# Patient Record
Sex: Male | Born: 1980 | Race: Black or African American | Hispanic: No | Smoking: Current some day smoker
Health system: Southern US, Community
[De-identification: ages and names within clinical notes are randomized; demographics above are authoritative.]

## PROBLEM LIST (undated history)

## (undated) DIAGNOSIS — I10 Essential (primary) hypertension: Secondary | ICD-10-CM

## (undated) DIAGNOSIS — N289 Disorder of kidney and ureter, unspecified: Secondary | ICD-10-CM

## (undated) DIAGNOSIS — I214 Non-ST elevation (NSTEMI) myocardial infarction: Secondary | ICD-10-CM

## (undated) DIAGNOSIS — I2510 Atherosclerotic heart disease of native coronary artery without angina pectoris: Secondary | ICD-10-CM

## (undated) DIAGNOSIS — Z951 Presence of aortocoronary bypass graft: Secondary | ICD-10-CM

## (undated) DIAGNOSIS — I255 Ischemic cardiomyopathy: Secondary | ICD-10-CM

---
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
## 2005-03-25 ENCOUNTER — Emergency Department (HOSPITAL_COMMUNITY): Admission: EM | Admit: 2005-03-25 | Discharge: 2005-03-25 | Payer: Self-pay | Admitting: Family Medicine

## 2005-04-22 ENCOUNTER — Emergency Department (HOSPITAL_COMMUNITY): Admission: EM | Admit: 2005-04-22 | Discharge: 2005-04-22 | Payer: Self-pay | Admitting: Family Medicine

## 2005-07-17 ENCOUNTER — Emergency Department (HOSPITAL_COMMUNITY): Admission: EM | Admit: 2005-07-17 | Discharge: 2005-07-17 | Payer: Self-pay | Admitting: Family Medicine

## 2006-01-04 ENCOUNTER — Emergency Department (HOSPITAL_COMMUNITY): Admission: AD | Admit: 2006-01-04 | Discharge: 2006-01-04 | Payer: Self-pay | Admitting: Family Medicine

## 2006-12-25 ENCOUNTER — Emergency Department (HOSPITAL_COMMUNITY): Admission: EM | Admit: 2006-12-25 | Discharge: 2006-12-25 | Payer: Self-pay | Admitting: Emergency Medicine

## 2007-04-03 ENCOUNTER — Emergency Department (HOSPITAL_COMMUNITY): Admission: EM | Admit: 2007-04-03 | Discharge: 2007-04-03 | Payer: Self-pay | Admitting: Family Medicine

## 2007-04-26 ENCOUNTER — Emergency Department (HOSPITAL_COMMUNITY): Admission: EM | Admit: 2007-04-26 | Discharge: 2007-04-27 | Payer: Self-pay | Admitting: Emergency Medicine

## 2007-05-26 ENCOUNTER — Emergency Department (HOSPITAL_COMMUNITY): Admission: EM | Admit: 2007-05-26 | Discharge: 2007-05-26 | Payer: Self-pay | Admitting: Emergency Medicine

## 2008-03-11 ENCOUNTER — Emergency Department (HOSPITAL_COMMUNITY): Admission: EM | Admit: 2008-03-11 | Discharge: 2008-03-11 | Payer: Self-pay | Admitting: Emergency Medicine

## 2008-10-20 ENCOUNTER — Emergency Department (HOSPITAL_COMMUNITY): Admission: EM | Admit: 2008-10-20 | Discharge: 2008-10-20 | Payer: Self-pay | Admitting: Family Medicine

## 2008-11-22 ENCOUNTER — Emergency Department (HOSPITAL_COMMUNITY): Admission: EM | Admit: 2008-11-22 | Discharge: 2008-11-22 | Payer: Self-pay | Admitting: Family Medicine

## 2009-01-24 ENCOUNTER — Emergency Department (HOSPITAL_COMMUNITY): Admission: EM | Admit: 2009-01-24 | Discharge: 2009-01-24 | Payer: Self-pay | Admitting: Family Medicine

## 2010-05-31 ENCOUNTER — Emergency Department (HOSPITAL_COMMUNITY)
Admission: EM | Admit: 2010-05-31 | Discharge: 2010-05-31 | Disposition: A | Discharge status: Home / Self Care | Payer: Self-pay | Attending: Emergency Medicine | Admitting: Emergency Medicine

## 2010-05-31 DIAGNOSIS — F1210 Cannabis abuse, uncomplicated: Secondary | ICD-10-CM | POA: Insufficient documentation

## 2010-05-31 DIAGNOSIS — R112 Nausea with vomiting, unspecified: Secondary | ICD-10-CM | POA: Insufficient documentation

## 2010-08-23 ENCOUNTER — Ambulatory Visit: Payer: Self-pay | Admitting: Sports Medicine

## 2010-12-14 LAB — CBC
HCT: 52
Hemoglobin: 17.6 — ABNORMAL HIGH
MCHC: 33.9
MCV: 87.7
RBC: 5.93 — ABNORMAL HIGH
RDW: 14.8

## 2010-12-14 LAB — COMPREHENSIVE METABOLIC PANEL
BUN: 7
CO2: 27
Calcium: 10.2
GFR calc non Af Amer: >60
Glucose, Bld: 114 — ABNORMAL HIGH
Total Protein: 8.3

## 2010-12-14 LAB — DIFFERENTIAL
Basophils Relative: 0
Eosinophils Absolute: 0
Lymphs Abs: 0.5 — ABNORMAL LOW
Monocytes Relative: 4
Neutro Abs: 11.3 — ABNORMAL HIGH
Neutrophils Relative %: 92 — ABNORMAL HIGH

## 2010-12-14 LAB — URINALYSIS, ROUTINE W REFLEX MICROSCOPIC
Bilirubin Urine: NEGATIVE
Glucose, UA: NEGATIVE
Hgb urine dipstick: NEGATIVE
Ketones, ur: NEGATIVE
pH: 5.5

## 2010-12-17 LAB — MONONUCLEOSIS SCREEN: Mono Screen: NEGATIVE

## 2010-12-17 LAB — INFLUENZA A+B VIRUS AG-DIRECT(RAPID): Inflenza A Ag: NEGATIVE

## 2010-12-17 LAB — RAPID STREP SCREEN (MED CTR MEBANE ONLY): Streptococcus, Group A Screen (Direct): NEGATIVE

## 2010-12-28 LAB — COMPREHENSIVE METABOLIC PANEL
ALT: 33 U/L (ref 0–53)
Alkaline Phosphatase: 78 U/L (ref 39–117)
BUN: 11 mg/dL (ref 6–23)
CO2: 24 mEq/L (ref 19–32)
Chloride: 102 mEq/L (ref 96–112)
GFR calc non Af Amer: >60 mL/min (ref >60)
Glucose, Bld: 105 mg/dL — ABNORMAL HIGH (ref 70–99)
Potassium: 3.9 mEq/L (ref 3.5–5.1)
Sodium: 139 mEq/L (ref 135–145)
Total Bilirubin: 1.9 mg/dL — ABNORMAL HIGH (ref 0.3–1.2)

## 2010-12-28 LAB — URINE MICROSCOPIC-ADD ON

## 2010-12-28 LAB — CBC
HCT: 55.6 % — ABNORMAL HIGH (ref 39.0–52.0)
Hemoglobin: 18.6 g/dL — ABNORMAL HIGH (ref 13.0–17.0)
RBC: 6.3 MIL/uL — ABNORMAL HIGH (ref 4.22–5.81)

## 2010-12-28 LAB — URINALYSIS, ROUTINE W REFLEX MICROSCOPIC
Glucose, UA: NEGATIVE mg/dL
Ketones, ur: 15 mg/dL — AB
Leukocytes, UA: NEGATIVE
Protein, ur: >300 mg/dL — AB
pH: 5.5 (ref 5.0–8.0)

## 2010-12-28 LAB — DIFFERENTIAL
Basophils Absolute: 0 10*3/uL (ref 0.0–0.1)
Basophils Relative: 0 % (ref 0–1)
Eosinophils Absolute: 0 10*3/uL (ref 0.0–0.7)
Monocytes Relative: 5 % (ref 3–12)
Neutro Abs: 8 10*3/uL — ABNORMAL HIGH (ref 1.7–7.7)
Neutrophils Relative %: 79 % — ABNORMAL HIGH (ref 43–77)

## 2011-02-15 ENCOUNTER — Emergency Department (HOSPITAL_COMMUNITY): Payer: Self-pay

## 2011-02-15 ENCOUNTER — Encounter: Payer: Self-pay | Admitting: *Deleted

## 2011-02-15 ENCOUNTER — Emergency Department (HOSPITAL_COMMUNITY)
Admission: EM | Admit: 2011-02-15 | Discharge: 2011-02-15 | Disposition: A | Discharge status: Home / Self Care | Payer: Self-pay | Attending: Emergency Medicine | Admitting: Emergency Medicine

## 2011-02-15 DIAGNOSIS — Z79899 Other long term (current) drug therapy: Secondary | ICD-10-CM | POA: Insufficient documentation

## 2011-02-15 DIAGNOSIS — R112 Nausea with vomiting, unspecified: Secondary | ICD-10-CM | POA: Insufficient documentation

## 2011-02-15 DIAGNOSIS — G43909 Migraine, unspecified, not intractable, without status migrainosus: Secondary | ICD-10-CM | POA: Insufficient documentation

## 2011-02-15 DIAGNOSIS — J019 Acute sinusitis, unspecified: Secondary | ICD-10-CM | POA: Insufficient documentation

## 2011-02-15 LAB — GLUCOSE, CAPILLARY: Glucose-Capillary: 108 mg/dL — ABNORMAL HIGH (ref 70–99)

## 2011-02-15 MED ORDER — AMOXICILLIN 500 MG PO CAPS: 1000.0000 mg | ORAL_CAPSULE | Freq: Three times a day (TID) | ORAL | Status: AC

## 2011-02-15 MED ORDER — SODIUM CHLORIDE 0.9 % IV BOLUS (SEPSIS)
1000.0000 mL | Freq: Once | INTRAVENOUS | Status: AC
  Administered 2011-02-15: 1000 mL via INTRAVENOUS

## 2011-02-15 MED ORDER — OXYCODONE-ACETAMINOPHEN 5-325 MG PO TABS: 2.0000 | ORAL_TABLET | ORAL | Status: AC | PRN

## 2011-02-15 MED ORDER — HYDROMORPHONE HCL PF 1 MG/ML IJ SOLN: 1.0000 mg | Freq: Once | INTRAMUSCULAR | Status: DC

## 2011-02-15 MED ORDER — ONDANSETRON HCL 4 MG/2ML IJ SOLN
INTRAMUSCULAR | Status: AC
  Administered 2011-02-15: 4 mg via INTRAVENOUS
  Filled 2011-02-15: qty 2

## 2011-02-15 MED ORDER — AMOXICILLIN 500 MG PO CAPS
1000.0000 mg | ORAL_CAPSULE | Freq: Once | ORAL | Status: AC
  Administered 2011-02-15 (×2): 1000 mg via ORAL
  Filled 2011-02-15: qty 1

## 2011-02-15 MED ORDER — AMOXICILLIN 500 MG PO CAPS
ORAL_CAPSULE | ORAL | Status: AC
  Administered 2011-02-15: 1000 mg via ORAL
  Filled 2011-02-15: qty 1

## 2011-02-15 MED ORDER — DIPHENHYDRAMINE HCL 50 MG/ML IJ SOLN
25.0000 mg | Freq: Once | INTRAMUSCULAR | Status: AC
  Administered 2011-02-15: 25 mg via INTRAVENOUS
  Filled 2011-02-15: qty 1

## 2011-02-15 MED ORDER — DEXAMETHASONE SODIUM PHOSPHATE 10 MG/ML IJ SOLN
10.0000 mg | Freq: Once | INTRAMUSCULAR | Status: AC
  Administered 2011-02-15: 10 mg via INTRAVENOUS
  Filled 2011-02-15: qty 1

## 2011-02-15 MED ORDER — PROMETHAZINE HCL 25 MG/ML IJ SOLN
25.0000 mg | Freq: Once | INTRAMUSCULAR | Status: AC
  Administered 2011-02-15: 25 mg via INTRAVENOUS
  Filled 2011-02-15: qty 1

## 2011-02-15 MED ORDER — IBUPROFEN 800 MG PO TABS
800.0000 mg | ORAL_TABLET | Freq: Once | ORAL | Status: AC
  Administered 2011-02-15: 800 mg via ORAL
  Filled 2011-02-15: qty 1

## 2011-02-15 MED ORDER — IBUPROFEN 800 MG PO TABS: 800.0000 mg | ORAL_TABLET | Freq: Three times a day (TID) | ORAL | Status: AC | PRN

## 2011-02-15 NOTE — ED Provider Notes (Signed)
History     CSN: [id number] Arrival date & time: 02/15/2011 10:37 AM   First MD Initiated Contact with Patient 02/15/11 1048      Chief Complaint  Patient presents with  . Headache  . Nausea    (Consider location/radiation/quality/duration/timing/severity/associated sxs/prior treatment) HPI Comments: The patient is a 30 year old male with a history of recurrent headaches who presents with a headache that he says is typical of his usual headaches. He reports that the headache started approximately one week ago gradually getting worse, starting as a mild headache, persisting, and gradually worsening to what would be a moderate to severe intensity headache. He reports that he has had nausea and some episodes of vomiting with the headache. He denies photophobia, visual change, hearing change, although he reports that loud noise makes his headache worse. He denies any fever, chills, rash, neck stiffness, or focal neurologic abnormality such as numbness or weakness. He denies any preceding head injury.  Patient is a 30 y.o. male presenting with headaches. The history is provided by the patient.  Headache  This is a recurrent problem. The current episode started more than 1 week ago. The problem occurs constantly. The problem has been gradually worsening. The headache is associated with loud noise. The pain is located in the frontal, bilateral and occipital region. The quality of the pain is described as dull. The pain is at a severity of 8/10. The pain does not radiate. Associated symptoms include nausea and vomiting. Pertinent negatives include no anorexia, no fever, no malaise/fatigue, no chest pressure, no near-syncope, no orthopnea, no palpitations, no syncope and no shortness of breath. He has tried nothing for the symptoms.    Past Medical History  Diagnosis Date  . Migraine     History reviewed. No pertinent past surgical history.  History reviewed. No pertinent family  history.  History  Substance Use Topics  . Smoking status: Current Everyday Smoker  . Smokeless tobacco: Not on file  . Alcohol Use: No      Review of Systems  Constitutional: Negative for fever, chills, malaise/fatigue, diaphoresis, activity change, appetite change, fatigue and unexpected weight change.  HENT: Negative for hearing loss, ear pain, nosebleeds, congestion, sore throat, facial swelling, rhinorrhea, sneezing, mouth sores, trouble swallowing, neck pain, neck stiffness, dental problem, postnasal drip, sinus pressure, tinnitus and ear discharge.   Eyes: Negative for photophobia, pain, discharge, redness, itching and visual disturbance.  Respiratory: Negative for cough, chest tightness and shortness of breath.   Cardiovascular: Negative for chest pain, palpitations, orthopnea, syncope and near-syncope.  Gastrointestinal: Positive for nausea and vomiting. Negative for abdominal pain, diarrhea and anorexia.  Genitourinary: Negative.   Musculoskeletal: Negative for myalgias and back pain.  Skin: Negative for color change, pallor, rash and wound.  Neurological: Positive for headaches. Negative for dizziness, tremors, seizures, syncope, facial asymmetry, speech difficulty, weakness, light-headedness and numbness.  Hematological: Negative for adenopathy. Does not bruise/bleed easily.  Psychiatric/Behavioral: Negative.     Allergies  Review of patient's allergies indicates no known allergies.  Home Medications   Current Outpatient Rx  Name Route Sig Dispense Refill  . ACETAMINOPHEN 500 MG PO TABS Oral Take 1,000 mg by mouth every 6 (six) hours as needed.      . GUAIFENESIN-DM 100-10 MG/5ML PO SYRP Oral Take 20 mLs by mouth 3 (three) times daily as needed.      Doreatha Martin MULTI-SYMPTOM PO Oral Take 20 mLs by mouth 2 (two) times daily.        BP  145/90  Pulse 91  Temp(Src) 99.2 F (37.3 C) (Oral)  Resp 22  Ht 5\' 8"  (1.727 m)  Wt 215 lb (97.523 kg)  BMI 32.69 kg/m2  SpO2  100%  Physical Exam  Nursing note and vitals reviewed. Constitutional: He is oriented to person, place, and time. He appears well-developed and well-nourished. He appears distressed.  HENT:  Head: Normocephalic and atraumatic. Head is without raccoon's eyes, without Battle's sign, without abrasion, without contusion, without right periorbital erythema and without left periorbital erythema.  Right Ear: Hearing, tympanic membrane, external ear and ear canal normal.  Left Ear: Hearing, tympanic membrane, external ear and ear canal normal.  Nose: No mucosal edema, rhinorrhea, sinus tenderness, nasal deformity, septal deviation or nasal septal hematoma. No epistaxis. Right sinus exhibits no maxillary sinus tenderness and no frontal sinus tenderness. Left sinus exhibits no maxillary sinus tenderness and no frontal sinus tenderness.  Mouth/Throat: Uvula is midline, oropharynx is clear and moist and mucous membranes are normal. No oropharyngeal exudate.  Eyes: Conjunctivae, EOM and lids are normal. Pupils are equal, round, and reactive to light. Right eye exhibits no chemosis, no discharge, no exudate and no hordeolum. No foreign body present in the right eye. Left eye exhibits no chemosis, no discharge, no exudate and no hordeolum. No foreign body present in the left eye. Right conjunctiva is not injected. Right conjunctiva has no hemorrhage. Left conjunctiva is not injected. Left conjunctiva has no hemorrhage. Right eye exhibits normal extraocular motion and no nystagmus. Left eye exhibits normal extraocular motion and no nystagmus.  Fundoscopic exam:      The right eye shows no AV nicking, no exudate, no hemorrhage and no papilledema.       The left eye shows no AV nicking, no exudate, no hemorrhage and no papilledema.  Neck: Trachea normal, normal range of motion, full passive range of motion without pain and phonation normal. Neck supple. No JVD present. No tracheal tenderness, no spinous process  tenderness and no muscular tenderness present. Carotid bruit is not present. No rigidity. No tracheal deviation, no edema, no erythema and normal range of motion present. No Brudzinski's sign noted. No mass and no thyromegaly present.  Cardiovascular: Normal rate, regular rhythm, normal heart sounds and intact distal pulses.  Exam reveals no gallop and no friction rub.   No murmur heard. Pulmonary/Chest: Effort normal and breath sounds normal. No stridor. No respiratory distress. He has no wheezes. He has no rales.  Abdominal: Soft. Bowel sounds are normal. He exhibits no distension and no mass. There is no tenderness. There is no rebound and no guarding.  Musculoskeletal: Normal range of motion. He exhibits no edema and no tenderness.  Neurological: He is alert and oriented to person, place, and time. He has normal reflexes. He displays no atrophy and no tremor. No cranial nerve deficit or sensory deficit. He exhibits normal muscle tone. He displays a negative Romberg sign. He displays no seizure activity. Coordination and gait normal. GCS eye subscore is 4. GCS verbal subscore is 5. GCS motor subscore is 6.  Reflex Scores:      Tricep reflexes are 2+ on the right side and 2+ on the left side.      Bicep reflexes are 2+ on the right side and 2+ on the left side.      Brachioradialis reflexes are 2+ on the right side and 2+ on the left side.      Patellar reflexes are 2+ on the right side and 2+ on  the left side.      Achilles reflexes are 2+ on the right side and 2+ on the left side.      Normal visual fields to confrontation, normal coordination left finger-nose-finger and heel-to-shin test, normal gait, no cerebellar signs, nonfocal neurologic exam.  Skin: Skin is warm and dry. No rash noted. He is not diaphoretic. No erythema. No pallor.  Psychiatric: He has a normal mood and affect. His behavior is normal. Judgment and thought content normal.    ED Course  Procedures (including critical care  time)  Labs Reviewed - No data to display No results found.   No diagnosis found.    MDM  Muscular tension headache, migraine headache or both entertained in the patient's differential diagnosis amongst other etiologies of his headache. His neurologic exam is nonfocal, with no cerebellar signs, or focal neurologic findings to suggest intracranial tumor or other localized pathological process.  I will treat him with anti-emetics, anti-histamines, and steroids as well as IV fluids and reassess for relief of headache.  On interviewing the patient further, he gives an inconsistent history about whether he sensed and headaches like this one before, and as such I am going to obtain a head CT scan to rule out intracranial pathology.        Felisa Bonier, MD 02/15/11 [phone]

## 2011-02-15 NOTE — ED Notes (Signed)
Pt states he has had a headache since last Friday and has increased in intensity. Pt c/o nausea and vomiting

## 2011-02-15 NOTE — ED Notes (Signed)
Pt's family reports that pt has been sweating-requesting to have his BG checked d/t family hx.  She reports that pt would have episodes of diaphoresis and shakes x 1 year.  Pt is A&Ox4.

## 2011-02-15 NOTE — ED Notes (Signed)
Discharge instructions reviewed with pt. And pt's wife. Prescriptions given to pt. Pt. And wife verbalized understanding. Pt. Discharged ambulatory.

## 2011-05-01 ENCOUNTER — Encounter (HOSPITAL_COMMUNITY): Payer: Self-pay | Admitting: Emergency Medicine

## 2011-05-01 ENCOUNTER — Emergency Department (HOSPITAL_COMMUNITY): Payer: No Typology Code available for payment source

## 2011-05-01 ENCOUNTER — Emergency Department (HOSPITAL_COMMUNITY)
Admission: EM | Admit: 2011-05-01 | Discharge: 2011-05-01 | Disposition: A | Discharge status: Home / Self Care | Payer: No Typology Code available for payment source | Attending: Emergency Medicine | Admitting: Emergency Medicine

## 2011-05-01 DIAGNOSIS — F17200 Nicotine dependence, unspecified, uncomplicated: Secondary | ICD-10-CM | POA: Insufficient documentation

## 2011-05-01 DIAGNOSIS — M542 Cervicalgia: Secondary | ICD-10-CM

## 2011-05-01 DIAGNOSIS — M25569 Pain in unspecified knee: Secondary | ICD-10-CM | POA: Insufficient documentation

## 2011-05-01 DIAGNOSIS — R5381 Other malaise: Secondary | ICD-10-CM | POA: Insufficient documentation

## 2011-05-01 DIAGNOSIS — M549 Dorsalgia, unspecified: Secondary | ICD-10-CM | POA: Insufficient documentation

## 2011-05-01 DIAGNOSIS — G43909 Migraine, unspecified, not intractable, without status migrainosus: Secondary | ICD-10-CM | POA: Insufficient documentation

## 2011-05-01 DIAGNOSIS — Z79899 Other long term (current) drug therapy: Secondary | ICD-10-CM | POA: Insufficient documentation

## 2011-05-01 DIAGNOSIS — R531 Weakness: Secondary | ICD-10-CM

## 2011-05-01 MED ORDER — HYDROCODONE-ACETAMINOPHEN 5-325 MG PO TABS
1.0000 | ORAL_TABLET | Freq: Once | ORAL | Status: AC
  Administered 2011-05-01: 1 via ORAL
  Filled 2011-05-01: qty 1

## 2011-05-01 MED ORDER — IBUPROFEN 600 MG PO TABS: 600.0000 mg | ORAL_TABLET | Freq: Four times a day (QID) | ORAL | Status: AC | PRN

## 2011-05-01 MED ORDER — HYDROCODONE-ACETAMINOPHEN 5-325 MG PO TABS: 2.0000 | ORAL_TABLET | ORAL | Status: AC | PRN
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
## 2012-11-03 ENCOUNTER — Encounter (HOSPITAL_COMMUNITY): Payer: Self-pay | Admitting: Emergency Medicine

## 2012-11-03 ENCOUNTER — Emergency Department (INDEPENDENT_AMBULATORY_CARE_PROVIDER_SITE_OTHER)
Admission: EM | Admit: 2012-11-03 | Discharge: 2012-11-03 | Disposition: A | Discharge status: Home / Self Care | Payer: Self-pay | Source: Home / Self Care | Attending: Family Medicine | Admitting: Family Medicine

## 2012-11-03 DIAGNOSIS — J020 Streptococcal pharyngitis: Secondary | ICD-10-CM

## 2012-11-03 MED ORDER — IBUPROFEN 800 MG PO TABS
ORAL_TABLET | ORAL | Status: AC
  Filled 2012-11-03: qty 1

## 2012-11-03 MED ORDER — CEFDINIR 300 MG PO CAPS: 300.0000 mg | ORAL_CAPSULE | Freq: Two times a day (BID) | ORAL | Status: DC

## 2012-11-03 MED ORDER — IBUPROFEN 800 MG PO TABS
800.0000 mg | ORAL_TABLET | Freq: Once | ORAL | Status: AC
  Administered 2012-11-03: 800 mg via ORAL

## 2012-11-03 NOTE — ED Notes (Signed)
During discharge instructions, patient requested ibuprofen, notified dr Artis Flock, received order.

## 2012-11-03 NOTE — ED Notes (Signed)
Reports sore throat , right ear pain and headache for 2 days.

## 2012-11-03 NOTE — ED Provider Notes (Signed)
  CSN: [id number]     Arrival date & time 11/03/12  0802 History     First MD Initiated Contact with Patient 11/03/12 [phone]     Chief Complaint  Patient presents with  . Sore Throat   (Consider location/radiation/quality/duration/timing/severity/associated sxs/prior Treatment) Patient is a 32 y.o. male presenting with pharyngitis. The history is provided by the patient.  Sore Throat This is a new problem. The problem has been gradually worsening. The symptoms are aggravated by swallowing.    Past Medical History  Diagnosis Date  . Migraine    History reviewed. No pertinent past surgical history. History reviewed. No pertinent family history. History  Substance Use Topics  . Smoking status: Current Every Day Smoker  . Smokeless tobacco: Not on file  . Alcohol Use: No    Review of Systems  Constitutional: Positive for fever and chills.  HENT: Positive for sore throat. Negative for congestion, rhinorrhea and postnasal drip.     Allergies  Review of patient's allergies indicates no known allergies.  Home Medications   Current Outpatient Rx  Name  Route  Sig  Dispense  Refill  . cefdinir (OMNICEF) 300 MG capsule   Oral   Take 1 capsule (300 mg total) by mouth 2 (two) times daily.   20 capsule   0    BP 161/97  Pulse 121  Temp(Src) 100.7 F (38.2 C) (Oral)  Resp 18  SpO2 98% Physical Exam  Nursing note and vitals reviewed. Constitutional: He is oriented to person, place, and time. He appears well-developed and well-nourished.  HENT:  Head: Normocephalic.  Right Ear: External ear normal.  Left Ear: External ear normal.  Nose: Nose normal.  Mouth/Throat: Uvula is midline and mucous membranes are normal. No edematous. Oropharyngeal exudate and posterior oropharyngeal erythema present.  Neck: Normal range of motion. Neck supple.  Lymphadenopathy:    He has cervical adenopathy.  Neurological: He is alert and oriented to person, place, and time.  Skin: Skin is warm  and dry.    ED Course   Procedures (including critical care time)  Labs Reviewed  POCT RAPID STREP A ([facility] ONLY) - Abnormal; Notable for the following:    Streptococcus, Group A Screen (Direct) POSITIVE (*)    All other components within normal limits   No results found. 1. Strep sore throat     MDM    Linna Hoff, MD 11/03/12 [phone]

## 2012-11-03 NOTE — ED Notes (Signed)
Instructed to provide work note for 2 days

## 2012-11-04 ENCOUNTER — Encounter (HOSPITAL_COMMUNITY): Payer: Self-pay | Admitting: Emergency Medicine

## 2012-11-04 ENCOUNTER — Emergency Department (HOSPITAL_COMMUNITY)
Admission: EM | Admit: 2012-11-04 | Discharge: 2012-11-04 | Disposition: A | Discharge status: Home / Self Care | Payer: Self-pay | Attending: Emergency Medicine | Admitting: Emergency Medicine

## 2012-11-04 DIAGNOSIS — Z8679 Personal history of other diseases of the circulatory system: Secondary | ICD-10-CM | POA: Insufficient documentation

## 2012-11-04 DIAGNOSIS — R51 Headache: Secondary | ICD-10-CM | POA: Insufficient documentation

## 2012-11-04 DIAGNOSIS — F17200 Nicotine dependence, unspecified, uncomplicated: Secondary | ICD-10-CM | POA: Insufficient documentation

## 2012-11-04 DIAGNOSIS — J039 Acute tonsillitis, unspecified: Secondary | ICD-10-CM | POA: Insufficient documentation

## 2012-11-04 DIAGNOSIS — R1310 Dysphagia, unspecified: Secondary | ICD-10-CM | POA: Insufficient documentation

## 2012-11-04 DIAGNOSIS — R509 Fever, unspecified: Secondary | ICD-10-CM | POA: Insufficient documentation

## 2012-11-04 MED ORDER — PREDNISONE 20 MG PO TABS: ORAL_TABLET | ORAL | Status: DC

## 2012-11-04 MED ORDER — DEXAMETHASONE SODIUM PHOSPHATE 10 MG/ML IJ SOLN
10.0000 mg | Freq: Once | INTRAMUSCULAR | Status: AC
  Administered 2012-11-04: 10 mg via INTRAMUSCULAR
  Filled 2012-11-04: qty 1

## 2012-11-04 MED ORDER — HYDROCODONE-ACETAMINOPHEN 7.5-325 MG/15ML PO SOLN: ORAL | Status: DC

## 2012-11-04 NOTE — ED Notes (Signed)
Sore throat x 4 days went to [facility] yesterday and given meds but not helping hurts to swollow dx w/ strep

## 2012-11-04 NOTE — ED Provider Notes (Signed)
CSN: [id number]     Arrival date & time 11/04/12  1704 History  This chart was scribed for Johnnette Gourd, PA, working with Junius Argyle, MD, by Ardelia Mems ED Scribe. This patient was seen in room TR06C/TR06C and the patient's care was started at 5:57 PM.    Chief Complaint  Patient presents with  . Sore Throat    The history is provided by the patient. No language interpreter was used.    HPI Comments: Ricardo [name] is a 32 y.o. male who presents to the Emergency Department complaining of a gradual onset, gradually worsening, constant, moderate sore throat over the past 4 days. He states that he is also having severe pain with swallowing and with talking. He states that he has not been able to eat much for the past 4 days. He reports an associated mild headache. He states that he was seen at Urgent Care yesterday, was diagnosed with Strep throat and has been taking Cefdinir along with Ibuprofen. He states that his pain has worsened since being seen yesterday. He reports associated fever and chills. He denies nausea, vomiting, rash or any other symptoms. Pt is a current every day smoker and deniies alcohol use.    Past Medical History  Diagnosis Date  . Migraine    History reviewed. No pertinent past surgical history. No family history on file. History  Substance Use Topics  . Smoking status: Current Every Day Smoker  . Smokeless tobacco: Not on file  . Alcohol Use: No    Review of Systems  Constitutional: Positive for fever and chills.  HENT: Positive for sore throat and trouble swallowing.   Gastrointestinal: Negative for nausea and vomiting.  Skin: Negative for rash.  Neurological: Positive for headaches.  All other systems reviewed and are negative.    Allergies  Review of patient's allergies indicates no known allergies.  Home Medications   Current Outpatient Rx  Name  Route  Sig  Dispense  Refill  . cefdinir (OMNICEF) 300 MG capsule   Oral   Take 300  mg by mouth 2 (two) times daily.         Marland Kitchen ibuprofen (ADVIL,MOTRIN) 200 MG tablet   Oral   Take 400 mg by mouth daily as needed for pain.          Triage Vitals: BP 151/82  Pulse 95  Temp(Src) 99.4 F (37.4 C) (Oral)  Resp 16  SpO2 99%  Physical Exam  Nursing note and vitals reviewed. Constitutional: He is oriented to person, place, and time. He appears well-developed and well-nourished. No distress.  HENT:  Head: Normocephalic and atraumatic.  Bilateral tonsillar adenopathy. Tonsils are enlarged and inflamed bilaterally +4 with exudate. No evdence of abscess.  Eyes: Conjunctivae and EOM are normal.  Neck: Normal range of motion. Neck supple.  Cardiovascular: Normal rate, regular rhythm and normal heart sounds.   Pulmonary/Chest: Effort normal and breath sounds normal.  Musculoskeletal: Normal range of motion. He exhibits no edema.  Neurological: He is alert and oriented to person, place, and time.  Skin: Skin is warm and dry.  Psychiatric: He has a normal mood and affect. His behavior is normal.    ED Course   Procedures (including critical care time)  DIAGNOSTIC STUDIES: Oxygen Saturation is 99% on RA, normal by my interpretation.    COORDINATION OF CARE: 5:59 PM- Pt advised of plan for treatment and pt agrees.    Labs Reviewed - No data to display  No results  found.  1. Tonsillitis     MDM  Patient with tonsillitis. He was prescribed Cefdinir yesterday by urgent care. Tonsils are enlarged and inflamed with bilateral exudate +4. No airway compromise. He is swallowing secretions well. No evidence of tonsillar abscess. I will give a shot of Decadron in the emergency department along with 4 days of prednisone. Lortab solution for pain. Discussed importance of completing the whole course of antibiotics. Return precautions discussed. Patient states understanding of plan and is agreeable.     I personally performed the services described in this documentation,  which was scribed in my presence. The recorded information has been reviewed and is accurate.   Trevor Mace, PA-C 11/04/12 1829

## 2012-11-04 NOTE — ED Notes (Signed)
PT WAS SEEN AT [facility] YESTERDAY AM. DX'D WITH STREP AND PUT ON ABX. PT HERE TODAY FOR C/O PAIN. RIGHT NECK AND EAR PAIN.

## 2012-11-05 NOTE — ED Provider Notes (Signed)
Medical screening examination/treatment/procedure(s) were performed by non-physician practitioner and as supervising physician I was immediately available for consultation/collaboration.   Junius Argyle, MD 11/05/12 1104

## 2012-11-06 NOTE — ED Notes (Signed)
Mother called, concerned about non-stop hiccoughs. Advised we cannot discuss adult son's care or lab results with her, even though this is her child. Asked her to have son call us. Discussed w Dr Leslee Home, who authorized to d/c cefdinir, and start Pen-v-k 500 mg PO, TID x 10 days , dispense QS. Waiting for patient to return call to discuss CIO

## 2012-11-06 NOTE — ED Notes (Signed)
Spoke w patient, and have called new Rx in to [facility] , [address], [place] ; spoke directly w pharamacist

## 2013-01-12 ENCOUNTER — Encounter (HOSPITAL_COMMUNITY): Payer: Self-pay | Admitting: Emergency Medicine

## 2013-01-12 ENCOUNTER — Emergency Department (HOSPITAL_COMMUNITY)
Admission: EM | Admit: 2013-01-12 | Discharge: 2013-01-13 | Disposition: A | Discharge status: Home / Self Care | Payer: Self-pay | Attending: Emergency Medicine | Admitting: Emergency Medicine

## 2013-01-12 DIAGNOSIS — Z8679 Personal history of other diseases of the circulatory system: Secondary | ICD-10-CM | POA: Insufficient documentation

## 2013-01-12 DIAGNOSIS — R319 Hematuria, unspecified: Secondary | ICD-10-CM | POA: Insufficient documentation

## 2013-01-12 DIAGNOSIS — Z791 Long term (current) use of non-steroidal anti-inflammatories (NSAID): Secondary | ICD-10-CM | POA: Insufficient documentation

## 2013-01-12 DIAGNOSIS — F17200 Nicotine dependence, unspecified, uncomplicated: Secondary | ICD-10-CM | POA: Insufficient documentation

## 2013-01-12 DIAGNOSIS — R109 Unspecified abdominal pain: Secondary | ICD-10-CM | POA: Insufficient documentation

## 2013-01-12 LAB — URINALYSIS, ROUTINE W REFLEX MICROSCOPIC
Bilirubin Urine: NEGATIVE
Glucose, UA: NEGATIVE mg/dL
Ketones, ur: NEGATIVE mg/dL
Protein, ur: 30 mg/dL — AB
Urobilinogen, UA: 0.2 mg/dL (ref 0.0–1.0)

## 2013-01-12 LAB — CBC WITH DIFFERENTIAL/PLATELET
Basophils Absolute: 0 10*3/uL (ref 0.0–0.1)
Eosinophils Absolute: 0.3 10*3/uL (ref 0.0–0.7)
Eosinophils Relative: 4 % (ref 0–5)
HCT: 45.7 % (ref 39.0–52.0)
Lymphocytes Relative: 48 % — ABNORMAL HIGH (ref 12–46)
Lymphs Abs: 3.8 10*3/uL (ref 0.7–4.0)
MCH: 29.8 pg (ref 26.0–34.0)
MCV: 84.6 fL (ref 78.0–100.0)
Monocytes Absolute: 0.4 10*3/uL (ref 0.1–1.0)
Platelets: 241 10*3/uL (ref 150–400)
RDW: 14 % (ref 11.5–15.5)
WBC: 7.9 10*3/uL (ref 4.0–10.5)

## 2013-01-12 LAB — BASIC METABOLIC PANEL
CO2: 24 mEq/L (ref 19–32)
Calcium: 9.7 mg/dL (ref 8.4–10.5)
Creatinine, Ser: 0.93 mg/dL (ref 0.50–1.35)
GFR calc non Af Amer: >90 mL/min (ref >90)
Glucose, Bld: 101 mg/dL — ABNORMAL HIGH (ref 70–99)

## 2013-01-12 NOTE — ED Provider Notes (Signed)
CSN: [id number]     Arrival date & time 01/12/13  2101 History   First MD Initiated Contact with Patient 01/12/13 2124     Chief Complaint  Patient presents with  . Hematuria   (Consider location/radiation/quality/duration/timing/severity/associated sxs/prior Treatment) HPI Comments: Patient presents emergency department with chief complaint of hematuria. He states that the symptoms began this morning. He also endorses associated bilateral flank pain. He states that he has had this pain for many years. He denies fevers, chills, nausea, vomiting, diarrhea, or constipation. He denies any penile discharge. Denies any new sexual partners. He denies any other health problems at this time. He has not tried taking anything to alleviate his symptoms. He thinks that his symptoms are related to drinking too many sodas.  The history is provided by the patient. No language interpreter was used.    Past Medical History  Diagnosis Date  . Migraine    History reviewed. No pertinent past surgical history. History reviewed. No pertinent family history. History  Substance Use Topics  . Smoking status: Current Every Day Smoker -- 0.50 packs/day  . Smokeless tobacco: Never Used  . Alcohol Use: No    Review of Systems  All other systems reviewed and are negative.    Allergies  Review of patient's allergies indicates no known allergies.  Home Medications   Current Outpatient Rx  Name  Route  Sig  Dispense  Refill  . naproxen (NAPROSYN) 250 MG tablet   Oral   Take 375 mg by mouth 2 (two) times daily with a meal.          BP 129/73  Pulse 89  Temp(Src) 98.4 F (36.9 C) (Oral)  Resp 16  Ht 5\' 8"  (1.727 m)  Wt 213 lb (96.616 kg)  BMI 32.39 kg/m2  SpO2 99% Physical Exam  Nursing note and vitals reviewed. Constitutional: He is oriented to person, place, and time. He appears well-developed and well-nourished.  HENT:  Head: Normocephalic and atraumatic.  Eyes: Conjunctivae and EOM are  normal. Pupils are equal, round, and reactive to light. Right eye exhibits no discharge. Left eye exhibits no discharge. No scleral icterus.  Neck: Normal range of motion. Neck supple. No JVD present.  Cardiovascular: Normal rate, regular rhythm, normal heart sounds and intact distal pulses.  Exam reveals no gallop and no friction rub.   No murmur heard. Pulmonary/Chest: Effort normal and breath sounds normal. No respiratory distress. He has no wheezes. He has no rales. He exhibits no tenderness.  Abdominal: Soft. He exhibits no distension and no mass. There is no tenderness. There is no rebound and no guarding.  Musculoskeletal: Normal range of motion. He exhibits no edema and no tenderness.  Neurological: He is alert and oriented to person, place, and time.  Skin: Skin is warm and dry.  Psychiatric: He has a normal mood and affect. His behavior is normal. Judgment and thought content normal.    ED Course  Procedures (including critical care time) Results for orders placed during the hospital encounter of 01/12/13  URINALYSIS, ROUTINE W REFLEX MICROSCOPIC      Result Value Range   Color, Urine BROWN (*) YELLOW   APPearance CLOUDY (*) CLEAR   Specific Gravity, Urine 1.022  1.005 - 1.030   pH 5.5  5.0 - 8.0   Glucose, UA NEGATIVE  NEGATIVE mg/dL   Hgb urine dipstick LARGE (*) NEGATIVE   Bilirubin Urine NEGATIVE  NEGATIVE   Ketones, ur NEGATIVE  NEGATIVE mg/dL   Protein, ur  30 (*) NEGATIVE mg/dL   Urobilinogen, UA 0.2  0.0 - 1.0 mg/dL   Nitrite NEGATIVE  NEGATIVE   Leukocytes, UA NEGATIVE  NEGATIVE  CBC WITH DIFFERENTIAL      Result Value Range   WBC 7.9  4.0 - 10.5 K/uL   RBC 5.40  4.22 - 5.81 MIL/uL   Hemoglobin 16.1  13.0 - 17.0 g/dL   HCT 14.7  82.9 - 56.2 %   MCV 84.6  78.0 - 100.0 fL   MCH 29.8  26.0 - 34.0 pg   MCHC 35.2  30.0 - 36.0 g/dL   RDW 13.0  86.5 - 78.4 %   Platelets 241  150 - 400 K/uL   Neutrophils Relative % 43  43 - 77 %   Neutro Abs 3.4  1.7 - 7.7 K/uL    Lymphocytes Relative 48 (*) 12 - 46 %   Lymphs Abs 3.8  0.7 - 4.0 K/uL   Monocytes Relative 5  3 - 12 %   Monocytes Absolute 0.4  0.1 - 1.0 K/uL   Eosinophils Relative 4  0 - 5 %   Eosinophils Absolute 0.3  0.0 - 0.7 K/uL   Basophils Relative 0  0 - 1 %   Basophils Absolute 0.0  0.0 - 0.1 K/uL  BASIC METABOLIC PANEL      Result Value Range   Sodium 135  135 - 145 mEq/L   Potassium 3.7  3.5 - 5.1 mEq/L   Chloride 103  96 - 112 mEq/L   CO2 24  19 - 32 mEq/L   Glucose, Bld 101 (*) 70 - 99 mg/dL   BUN 9  6 - 23 mg/dL   Creatinine, Ser 6.96  0.50 - 1.35 mg/dL   Calcium 9.7  8.4 - 29.5 mg/dL   GFR calc non Af Amer >90  >90 mL/min   GFR calc Af Amer >90  >90 mL/min  URINE MICROSCOPIC-ADD ON      Result Value Range   RBC / HPF TOO NUMEROUS TO COUNT  <3 RBC/hpf   Urine-Other URINALYSIS PERFORMED ON SUPERNATANT         EKG Interpretation   None       MDM   1. Hematuria     Patient with hematuria x1 day. We'll check urinalysis, will reevaluate.  UA show red cells.  No CVA tenderness.  No fevers, nausea, vomiting, or diarrhea.  Patient states that he always has back pain, and that there is nothing new about his back pain. He states he doesn't drink water. He is not in any apparent distress.  He denies any trauma or other MOI.  Patient is not having pain with urination.  Blood tests are reassuring.  I discussed the patient with Dr. Romeo Apple, who tells me to have the patient follow-up with PCP or urology.  No other treatments are indicated in the ED for painless hematuria.  Patient advised to drink lots of water and to return for new or worsening symptoms.    Roxy Horseman, PA-C 01/13/13 0020

## 2013-01-12 NOTE — ED Notes (Signed)
Pt reports that he has been having rectal bleeding and has concentrated urine with lower back pain. Pt reports "I know I just drink too many sodas, I just don't want to believe it's blood."

## 2013-01-13 NOTE — ED Provider Notes (Signed)
Medical screening examination/treatment/procedure(s) were performed by non-physician practitioner and as supervising physician I was immediately available for consultation/collaboration.   Junius Argyle, MD 01/13/13 [phone]

## 2013-05-21 ENCOUNTER — Encounter (HOSPITAL_COMMUNITY)
Admission: EM | Disposition: A | Discharge status: Home / Self Care | Payer: Self-pay | Source: Home / Self Care | Attending: Internal Medicine

## 2013-05-21 ENCOUNTER — Emergency Department (HOSPITAL_COMMUNITY): Payer: Self-pay

## 2013-05-21 ENCOUNTER — Encounter (HOSPITAL_COMMUNITY): Payer: Self-pay | Admitting: Emergency Medicine

## 2013-05-21 ENCOUNTER — Inpatient Hospital Stay (HOSPITAL_COMMUNITY)
Admission: EM | Admit: 2013-05-21 | Discharge: 2013-05-29 | DRG: 234 | Disposition: A | Discharge status: Home / Self Care | Payer: Self-pay | Attending: Cardiothoracic Surgery | Admitting: Cardiothoracic Surgery

## 2013-05-21 DIAGNOSIS — I255 Ischemic cardiomyopathy: Secondary | ICD-10-CM

## 2013-05-21 DIAGNOSIS — I2589 Other forms of chronic ischemic heart disease: Secondary | ICD-10-CM | POA: Diagnosis present

## 2013-05-21 DIAGNOSIS — K5900 Constipation, unspecified: Secondary | ICD-10-CM | POA: Diagnosis not present

## 2013-05-21 DIAGNOSIS — Z79899 Other long term (current) drug therapy: Secondary | ICD-10-CM

## 2013-05-21 DIAGNOSIS — I2510 Atherosclerotic heart disease of native coronary artery without angina pectoris: Secondary | ICD-10-CM

## 2013-05-21 DIAGNOSIS — R079 Chest pain, unspecified: Secondary | ICD-10-CM

## 2013-05-21 DIAGNOSIS — I214 Non-ST elevation (NSTEMI) myocardial infarction: Principal | ICD-10-CM | POA: Diagnosis present

## 2013-05-21 DIAGNOSIS — I252 Old myocardial infarction: Secondary | ICD-10-CM | POA: Diagnosis present

## 2013-05-21 DIAGNOSIS — Z951 Presence of aortocoronary bypass graft: Secondary | ICD-10-CM

## 2013-05-21 DIAGNOSIS — I10 Essential (primary) hypertension: Secondary | ICD-10-CM | POA: Diagnosis present

## 2013-05-21 DIAGNOSIS — I079 Rheumatic tricuspid valve disease, unspecified: Secondary | ICD-10-CM | POA: Diagnosis present

## 2013-05-21 DIAGNOSIS — F17200 Nicotine dependence, unspecified, uncomplicated: Secondary | ICD-10-CM | POA: Diagnosis present

## 2013-05-21 DIAGNOSIS — E8779 Other fluid overload: Secondary | ICD-10-CM | POA: Diagnosis present

## 2013-05-21 DIAGNOSIS — K219 Gastro-esophageal reflux disease without esophagitis: Secondary | ICD-10-CM | POA: Diagnosis present

## 2013-05-21 DIAGNOSIS — Z8249 Family history of ischemic heart disease and other diseases of the circulatory system: Secondary | ICD-10-CM

## 2013-05-21 HISTORY — DX: Essential (primary) hypertension: I10

## 2013-05-21 HISTORY — DX: Disorder of kidney and ureter, unspecified: N28.9

## 2013-05-21 HISTORY — DX: Ischemic cardiomyopathy: I25.5

## 2013-05-21 HISTORY — DX: Atherosclerotic heart disease of native coronary artery without angina pectoris: I25.10

## 2013-05-21 HISTORY — PX: CARDIAC CATHETERIZATION: SHX172

## 2013-05-21 HISTORY — PX: LEFT HEART CATHETERIZATION WITH CORONARY ANGIOGRAM: SHX5451

## 2013-05-21 HISTORY — DX: Non-ST elevation (NSTEMI) myocardial infarction: I21.4

## 2013-05-21 LAB — COMPREHENSIVE METABOLIC PANEL
ALBUMIN: 4.1 g/dL (ref 3.5–5.2)
ALT: 25 U/L (ref 0–53)
AST: 71 U/L — AB (ref 0–37)
Alkaline Phosphatase: 72 U/L (ref 39–117)
BILIRUBIN TOTAL: 0.6 mg/dL (ref 0.3–1.2)
BUN: 7 mg/dL (ref 6–23)
CALCIUM: 9.5 mg/dL (ref 8.4–10.5)
CHLORIDE: 102 meq/L (ref 96–112)
CO2: 24 mEq/L (ref 19–32)
CREATININE: 0.94 mg/dL (ref 0.50–1.35)
GFR calc Af Amer: >90 mL/min (ref >90)
Glucose, Bld: 104 mg/dL — ABNORMAL HIGH (ref 70–99)
Potassium: 4.2 mEq/L (ref 3.7–5.3)
Sodium: 140 mEq/L (ref 137–147)
Total Protein: 7.5 g/dL (ref 6.0–8.3)

## 2013-05-21 LAB — CBC WITH DIFFERENTIAL/PLATELET
BASOS PCT: 0 % (ref 0–1)
Basophils Absolute: 0 10*3/uL (ref 0.0–0.1)
EOS ABS: 0.2 10*3/uL (ref 0.0–0.7)
EOS PCT: 2 % (ref 0–5)
HCT: 45.1 % (ref 39.0–52.0)
Hemoglobin: 15.9 g/dL (ref 13.0–17.0)
Lymphocytes Relative: 19 % (ref 12–46)
Lymphs Abs: 2.1 10*3/uL (ref 0.7–4.0)
MCH: 29.8 pg (ref 26.0–34.0)
MCHC: 35.3 g/dL (ref 30.0–36.0)
MCV: 84.6 fL (ref 78.0–100.0)
MONO ABS: 1 10*3/uL (ref 0.1–1.0)
MONOS PCT: 9 % (ref 3–12)
NEUTROS PCT: 70 % (ref 43–77)
Neutro Abs: 7.6 10*3/uL (ref 1.7–7.7)
PLATELETS: 233 10*3/uL (ref 150–400)
RBC: 5.33 MIL/uL (ref 4.22–5.81)
RDW: 13.5 % (ref 11.5–15.5)
WBC: 10.9 10*3/uL — AB (ref 4.0–10.5)

## 2013-05-21 LAB — PROTIME-INR
INR: 1 (ref 0.00–1.49)
Prothrombin Time: 13 seconds (ref 11.6–15.2)

## 2013-05-21 LAB — TROPONIN I
TROPONIN I: 11.74 ng/mL — AB (ref <0.30)
Troponin I: 10.88 ng/mL (ref <0.30)

## 2013-05-21 LAB — RAPID URINE DRUG SCREEN, HOSP PERFORMED
Amphetamines: NOT DETECTED
BARBITURATES: NOT DETECTED
BENZODIAZEPINES: NOT DETECTED
COCAINE: NOT DETECTED
Opiates: POSITIVE — AB
TETRAHYDROCANNABINOL: POSITIVE — AB

## 2013-05-21 LAB — APTT: aPTT: 32 seconds (ref 24–37)

## 2013-05-21 LAB — MRSA PCR SCREENING: MRSA by PCR: NEGATIVE

## 2013-05-21 LAB — LIPASE, BLOOD: Lipase: 30 U/L (ref 11–59)

## 2013-05-21 LAB — D-DIMER, QUANTITATIVE (NOT AT ARMC): D DIMER QUANT: 0.7 ug{FEU}/mL — AB (ref 0.00–0.48)

## 2013-05-21 SURGERY — LEFT HEART CATHETERIZATION WITH CORONARY ANGIOGRAM
Anesthesia: LOCAL

## 2013-05-21 MED ORDER — SODIUM CHLORIDE 0.9 % IV SOLN: 1.0000 mL/kg/h | INTRAVENOUS | Status: AC

## 2013-05-21 MED ORDER — HEPARIN SODIUM (PORCINE) 1000 UNIT/ML IJ SOLN
INTRAMUSCULAR | Status: AC
  Filled 2013-05-21: qty 1

## 2013-05-21 MED ORDER — SODIUM CHLORIDE 0.9 % IV SOLN
Freq: Once | INTRAVENOUS | Status: AC
  Administered 2013-05-21: 14:00:00 via INTRAVENOUS

## 2013-05-21 MED ORDER — VERAPAMIL HCL 2.5 MG/ML IV SOLN
INTRAVENOUS | Status: AC
  Filled 2013-05-21: qty 2

## 2013-05-21 MED ORDER — LIDOCAINE HCL (PF) 1 % IJ SOLN
INTRAMUSCULAR | Status: AC
  Filled 2013-05-21: qty 30

## 2013-05-21 MED ORDER — NITROGLYCERIN IN D5W 200-5 MCG/ML-% IV SOLN
5.0000 ug/min | INTRAVENOUS | Status: DC
  Administered 2013-05-21: 5 ug/min via INTRAVENOUS
  Filled 2013-05-21: qty 250

## 2013-05-21 MED ORDER — HEPARIN (PORCINE) IN NACL 100-0.45 UNIT/ML-% IJ SOLN
1200.0000 [IU]/h | INTRAMUSCULAR | Status: DC
  Administered 2013-05-21: 1200 [IU]/h via INTRAVENOUS
  Filled 2013-05-21: qty 250

## 2013-05-21 MED ORDER — ALPRAZOLAM 0.25 MG PO TABS
0.2500 mg | ORAL_TABLET | Freq: Three times a day (TID) | ORAL | Status: DC | PRN
  Administered 2013-05-21 – 2013-05-23 (×3): 0.25 mg via ORAL
  Filled 2013-05-21 (×3): qty 1

## 2013-05-21 MED ORDER — HEPARIN (PORCINE) IN NACL 100-0.45 UNIT/ML-% IJ SOLN
1400.0000 [IU]/h | INTRAMUSCULAR | Status: DC
  Administered 2013-05-22: 1200 [IU]/h via INTRAVENOUS
  Administered 2013-05-22: 1400 [IU]/h via INTRAVENOUS
  Filled 2013-05-21 (×3): qty 250

## 2013-05-21 MED ORDER — ASPIRIN 81 MG PO CHEW
324.0000 mg | CHEWABLE_TABLET | Freq: Once | ORAL | Status: AC
  Administered 2013-05-21: 324 mg via ORAL
  Filled 2013-05-21: qty 4

## 2013-05-21 MED ORDER — ASPIRIN 81 MG PO CHEW
81.0000 mg | CHEWABLE_TABLET | Freq: Every day | ORAL | Status: DC
  Administered 2013-05-21 – 2013-05-24 (×4): 81 mg via ORAL
  Filled 2013-05-21 (×5): qty 1

## 2013-05-21 MED ORDER — ATORVASTATIN CALCIUM 80 MG PO TABS
80.0000 mg | ORAL_TABLET | Freq: Every day | ORAL | Status: DC
  Administered 2013-05-22 – 2013-05-26 (×4): 80 mg via ORAL
  Filled 2013-05-21 (×7): qty 1

## 2013-05-21 MED ORDER — HEPARIN (PORCINE) IN NACL 2-0.9 UNIT/ML-% IJ SOLN
INTRAMUSCULAR | Status: AC
  Filled 2013-05-21: qty 1500

## 2013-05-21 MED ORDER — MORPHINE SULFATE 2 MG/ML IJ SOLN
2.0000 mg | INTRAMUSCULAR | Status: DC | PRN
  Administered 2013-05-22 – 2013-05-23 (×3): 2 mg via INTRAVENOUS
  Filled 2013-05-21 (×3): qty 1

## 2013-05-21 MED ORDER — HEPARIN BOLUS VIA INFUSION
4000.0000 [IU] | Freq: Once | INTRAVENOUS | Status: AC
  Administered 2013-05-21: 4000 [IU] via INTRAVENOUS
  Filled 2013-05-21: qty 4000

## 2013-05-21 MED ORDER — PANTOPRAZOLE SODIUM 40 MG PO TBEC
40.0000 mg | DELAYED_RELEASE_TABLET | Freq: Every day | ORAL | Status: DC
  Administered 2013-05-21 – 2013-05-24 (×4): 40 mg via ORAL
  Filled 2013-05-21 (×5): qty 1

## 2013-05-21 MED ORDER — ONDANSETRON HCL 4 MG/2ML IJ SOLN
4.0000 mg | Freq: Once | INTRAMUSCULAR | Status: AC
  Administered 2013-05-21: 4 mg via INTRAVENOUS
  Filled 2013-05-21: qty 2

## 2013-05-21 MED ORDER — GI COCKTAIL ~~LOC~~
30.0000 mL | Freq: Once | ORAL | Status: AC
  Administered 2013-05-21: 30 mL via ORAL
  Filled 2013-05-21: qty 30

## 2013-05-21 MED ORDER — HEPARIN BOLUS VIA INFUSION: 4000.0000 [IU] | Freq: Once | INTRAVENOUS | Status: DC

## 2013-05-21 MED ORDER — MORPHINE SULFATE 4 MG/ML IJ SOLN
4.0000 mg | Freq: Once | INTRAMUSCULAR | Status: AC
  Administered 2013-05-21: 4 mg via INTRAVENOUS
  Filled 2013-05-21: qty 1

## 2013-05-21 MED ORDER — FENTANYL CITRATE 0.05 MG/ML IJ SOLN
INTRAMUSCULAR | Status: AC
  Filled 2013-05-21: qty 2

## 2013-05-21 MED ORDER — CARVEDILOL 6.25 MG PO TABS
6.2500 mg | ORAL_TABLET | Freq: Two times a day (BID) | ORAL | Status: DC
  Administered 2013-05-22 – 2013-05-23 (×3): 6.25 mg via ORAL
  Filled 2013-05-21 (×5): qty 1

## 2013-05-21 MED ORDER — NITROGLYCERIN 0.2 MG/ML ON CALL CATH LAB
INTRAVENOUS | Status: AC
  Filled 2013-05-21: qty 1

## 2013-05-21 MED ORDER — ACETAMINOPHEN 325 MG PO TABS
650.0000 mg | ORAL_TABLET | ORAL | Status: DC | PRN
  Administered 2013-05-21 – 2013-05-22 (×2): 650 mg via ORAL
  Filled 2013-05-21 (×2): qty 2

## 2013-05-21 MED ORDER — MIDAZOLAM HCL 2 MG/2ML IJ SOLN
INTRAMUSCULAR | Status: AC
  Filled 2013-05-21: qty 2

## 2013-05-21 NOTE — ED Provider Notes (Signed)
CSN: [id number]     Arrival date & time 05/21/13  2023 History   First MD Initiated Contact with Patient 05/21/13 1002     Chief Complaint  Patient presents with  . Chest Pain     (Consider location/radiation/quality/duration/timing/severity/associated sxs/prior Treatment) HPI Comments: Patient presents with constant central upper chest pain has been constant for the past 3 days. He feels radiates to his right arm causes a burning sensation in arm. He feels a little short of breath and nauseated. Denies any diarrhea, chills, cough or fever. One episode of vomiting yesterday. Has a history of hypertension, does not take medication. Denies any cardiac history. Denies any history of acid reflux or ulcers. He uses naproxen occasionally. The pain is slightly worse with palpation. Nothing makes it better. He denies any headache or back pain. He took a friend's Nexium with no relief.  The history is provided by the patient.    Past Medical History  Diagnosis Date  . Migraine   . Hypertension   . Renal disorder    History reviewed. No pertinent past surgical history. History reviewed. No pertinent family history. History  Substance Use Topics  . Smoking status: Current Every Day Smoker -- 0.75 packs/day    Types: Cigarettes  . Smokeless tobacco: Never Used  . Alcohol Use: No    Review of Systems  Constitutional: Negative for activity change and appetite change.  Respiratory: Positive for chest tightness. Negative for cough and shortness of breath.   Cardiovascular: Positive for chest pain.  Gastrointestinal: Positive for nausea. Negative for vomiting and abdominal pain.  Genitourinary: Negative for dysuria and hematuria.  Musculoskeletal: Negative for arthralgias and myalgias.  Skin: Negative for wound.  Neurological: Negative for dizziness, weakness and headaches.  A complete 10 system review of systems was obtained and all systems are negative except as noted in the HPI and PMH.       Allergies  Review of patient's allergies indicates no known allergies.  Home Medications   No current outpatient prescriptions on file. BP 137/74  Pulse 65  Temp(Src) 98.3 F (36.8 C) (Oral)  Resp 17  Ht 5\' 7"  (1.702 m)  Wt 216 lb (97.977 kg)  BMI 33.82 kg/m2  SpO2 100% Physical Exam  Constitutional: He is oriented to person, place, and time. He appears well-developed and well-nourished. No distress.  HENT:  Head: Normocephalic and atraumatic.  Mouth/Throat: Oropharynx is clear and moist. No oropharyngeal exudate.  Eyes: Conjunctivae and EOM are normal. Pupils are equal, round, and reactive to light.  Neck: Normal range of motion. Neck supple.  Cardiovascular: Normal rate, regular rhythm and normal heart sounds.   No murmur heard. Pulmonary/Chest: Effort normal and breath sounds normal. No respiratory distress. He exhibits tenderness.  Mild tenderness to central upper chest  Abdominal: Soft. There is tenderness. There is no rebound and no guarding.  Mild epigastric tenderness  Musculoskeletal: Normal range of motion. He exhibits no edema and no tenderness.  Neurological: He is alert and oriented to person, place, and time. No cranial nerve deficit. He exhibits normal muscle tone. Coordination normal.  Skin: Skin is warm.    ED Course  Procedures (including critical care time) Labs Review Labs Reviewed  CBC WITH DIFFERENTIAL - Abnormal; Notable for the following:    WBC 10.9 (*)    All other components within normal limits  COMPREHENSIVE METABOLIC PANEL - Abnormal; Notable for the following:    Glucose, Bld 104 (*)    AST 71 (*)  All other components within normal limits  TROPONIN I - Abnormal; Notable for the following:    Troponin I 11.74 (*)    All other components within normal limits  D-DIMER, QUANTITATIVE - Abnormal; Notable for the following:    D-Dimer, Quant 0.70 (*)    All other components within normal limits  TROPONIN I - Abnormal; Notable for  the following:    Troponin I 10.88 (*)    All other components within normal limits  URINE RAPID DRUG SCREEN (HOSP PERFORMED) - Abnormal; Notable for the following:    Opiates POSITIVE (*)    Tetrahydrocannabinol POSITIVE (*)    All other components within normal limits  LIPASE, BLOOD  APTT  PROTIME-INR  HEPARIN LEVEL (UNFRACTIONATED)   Imaging Review Dg Chest 2 View  05/21/2013   CLINICAL DATA:  Chest pain  EXAM: CHEST  2 VIEW  COMPARISON:  05/26/2007  FINDINGS: Cardiac enlargement. Cardiac silhouette appears larger compared with the prior study. Vascularity is normal. Lungs are clear without infiltrate effusion or mass.  IMPRESSION: Cardiac enlargement.  No acute abnormality.   Electronically Signed   By: Marlan Palauharles  Clark M.D.   On: 05/21/2013 10:59     EKG Interpretation  Date/Time:  Friday May 21 2013 10:08:09 EST Ventricular Rate:  58 PR Interval:  192 QRS Duration: 98 QT Interval:  458 QTC Calculation: 450 R Axis:   -122 Text Interpretation:  inferior lateral T wave inverions No previous ECGs available Confirmed by Manus GunningANCOUR  MD, Juliocesar Blasius ([id number]) on 05/21/2013 11:21:56 AM        MDM   Final diagnoses:  NSTEMI (non-ST elevated myocardial infarction)   3 day history of central upper chest pain it intermittently radiates to right arm. EKG shows LVH-type changes with inferior lateral T-wave inversions  Chest x-ray is negative. First troponin is 11.7. D-dimer 0.7.  No hypoxia.   Patient denies chest pain currently and states relief with GI cocktail. EKG shows T wave inversions inferior laterally. He started on aspirin and heparin drip. D/w Dr. Tenny Crawoss from cardiology who [facility].    Date: 05/21/2013  Rate: 58  Rhythm: normal sinus rhythm  QRS Axis: normal  Intervals: normal  ST/T Wave abnormalities: nonspecific ST/T changes  Conduction Disutrbances:none  Narrative Interpretation: inferior lateral T wave inversions  consistent with LVH.  Old EKG Reviewed: none available  CRITICAL CARE Performed by: Glynn OctaveANCOUR, Traeton Bordas Total critical care time: 30 Critical care time was exclusive of separately billable procedures and treating other patients. Critical care was necessary to treat or prevent imminent or life-threatening deterioration. Critical care was time spent personally by me on the following activities: development of treatment plan with patient and/or surrogate as well as nursing, discussions with consultants, evaluation of patient's response to treatment, examination of patient, obtaining history from patient or surrogate, ordering and performing treatments and interventions, ordering and review of laboratory studies, ordering and review of radiographic studies, pulse oximetry and re-evaluation of patient's condition.   Glynn OctaveStephen Manha Amato, MD 05/21/13 [phone]

## 2013-05-21 NOTE — ED Notes (Signed)
Call received from the lab with critical result trop = 11.74.  Dr. Manus Gunning notified at this time.

## 2013-05-21 NOTE — ED Notes (Signed)
Per pt has had intermittent central chest pain for past 3 days. Pt reports pain radiates to right arm and feels burning sensation in arm. Pt reports SOB with pain. Pt a/o x4 at present time.

## 2013-05-21 NOTE — ED Notes (Signed)
Bed: WA21 Expected date:  Expected time:  Means of arrival:  Comments: 

## 2013-05-21 NOTE — CV Procedure (Signed)
    Cardiac Catheterization Procedure Note  Name: Ricardo [name] MRN: [id number] DOB: Aug 09, 1980  Procedure: Left Heart Cath, Selective Coronary Angiography, LV angiography  Indication: 33 yo BM with history of HTN and tobacco abuse presents with a NSTEMI.   Procedural Details: The right wrist was prepped, draped, and anesthetized with 1% lidocaine. Using the modified Seldinger technique, a 5 French sheath was introduced into the right radial artery. 3 mg of verapamil was administered through the sheath, weight-based unfractionated heparin was administered intravenously. Standard Judkins catheters were used for selective coronary angiography and left ventriculography. Catheter exchanges were performed over an exchange length guidewire. There were no immediate procedural complications. A TR band was used for radial hemostasis at the completion of the procedure.  The patient was transferred to the post catheterization recovery area for further monitoring.  Procedural Findings: Hemodynamics: AO 117/80 mean 98 mm Hg LV 120/17 mm Hg  Coronary angiography: Coronary dominance: right  Left mainstem: Normal.   Left anterior descending (LAD): There is segmental 50-70% disease in the proximal LAD. The first diagonal has a 90% ostial stenosis then is subtotally occluded in the mid vessel.   Ramus intermediate: This is a very large vessel with 90% ostial stenosis.  Left circumflex (LCx): The LCx has diffuse 70% disease in the proximal vessel and then is occluded in the mid vessel. There are extensive bridging collaterals to the distal vessel with a large OM.  Right coronary artery (RCA): The RCA is occluded in the mid vessel with right to right and left to right collaterals.   Left ventriculography: Left ventricular systolic function is abnormal, there is global hypokinesis with severe hypokinesis of the anterior wall. LVEF is estimated at 30-35%. There is mild to moderate mitral regurgitation    Final Conclusions:   1. Severe 3 vessel obstructive CAD. The RCA and LCx occlusions appear chronic.  2. Severe LV dysfunction.  Recommendations: I reviewed the films with Dr. Eldridge Dace. Options for PCI are very limited with 2 major vessels with CTOs. Given LV dysfunction he needs complete revascularization and I recommend CABG. Will transfer to stepdown. Resume IV heparin post sheath removal. Continue ASA, IV Ntg, beta blockers, statin. Will check Echo.  Theron Arista [facility] 05/21/2013, 5:04 PM

## 2013-05-21 NOTE — Progress Notes (Signed)
ANTICOAGULATION CONSULT NOTE - Initial Consult  Pharmacy Consult for IV Heparin  Indication: ACS/STEMI  No Known Allergies  Patient Measurements: Height: 5\' 7"  (170.2 cm) Weight: 216 lb (97.977 kg) IBW/kg (Calculated) : 66.1 Heparin Dosing Weight:   Vital Signs: Temp: 98.3 F (36.8 C) (02/27 1418) Temp src: Oral (02/27 1418) BP: 137/74 mmHg (02/27 1515) Pulse Rate: 86 (02/27 1623)  Labs:  Recent Labs  05/21/13 1025 05/21/13 1335  HGB 15.9  --   HCT 45.1  --   PLT 233  --   APTT 32  --   LABPROT 13.0  --   INR 1.00  --   CREATININE 0.94  --   TROPONINI 11.74* 10.88*    Estimated Creatinine Clearance: 123.6 ml/min (by C-G formula based on Cr of 0.94).   Medical History: Past Medical History  Diagnosis Date  . Migraine   . Hypertension   . Renal disorder     Medications:  Scheduled:  . aspirin  81 mg Oral Daily  . atorvastatin  80 mg Oral q1800  . [START ON 05/22/2013] carvedilol  6.25 mg Oral BID WC  . pantoprazole  40 mg Oral Daily   Infusions:  . sodium chloride 1 mL/kg/hr (05/21/13 1730)  . nitroGLYCERIN 5 mcg/min (05/21/13 1340)   PRN:   Assessment: 33 yo who is s/p cath with severe 3VCAD. Heparin will be resume 8hr post cath while getting CABG consult.  Goal of Therapy:  Heparin level 0.3-0.7 units/ml Monitor platelets by anticoagulation protocol: Yes   Plan:   Resume heparin at 1200 units/hr at 0100 F/u with 6 hr heparin level

## 2013-05-21 NOTE — ED Notes (Signed)
Pt placed to cardiac/02 monitor, SB/SR on monitor, no ectopy.  Pt continues to report pain improved and denies complaints at this time.  NAD.

## 2013-05-21 NOTE — ED Notes (Signed)
Cardiology at bs for consult.

## 2013-05-21 NOTE — Interval H&P Note (Signed)
History and Physical Interval Note:  05/21/2013 4:22 PM  Ricardo [name]  has presented today for surgery, with the diagnosis of Urgent  The various methods of treatment have been discussed with the patient and family. After consideration of risks, benefits and other options for treatment, the patient has consented to  Procedure(s): LEFT HEART CATHETERIZATION WITH CORONARY ANGIOGRAM (N/A) as a surgical intervention .  The patient's history has been reviewed, patient examined, no change in status, stable for surgery.  I have reviewed the patient's chart and labs.  Questions were answered to the patient's satisfaction.   Cath Lab Visit (complete for each Cath Lab visit)  Clinical Evaluation Leading to the Procedure:   ACS: yes  Non-ACS:    Anginal Classification: CCS III  Anti-ischemic medical therapy: No Therapy  Non-Invasive Test Results: No non-invasive testing performed  Prior CABG: No previous CABG        Theron Arista [facility] 05/21/2013 4:22 PM

## 2013-05-21 NOTE — ED Notes (Signed)
Initial Contact - pt resting on stretcher with eyes closed.  Pt reports pain improved at this time "i'm feeling better".  Pt continues to report 4/10 pain to L chest rad into RUE.  Pt denies n/v/d/c, denies SOB.  Speaking full/clear sentences, rr even/un-lab.  MAEI.  Skin PWD.  Pt denies needs at this time.  NAD.

## 2013-05-21 NOTE — Consult Note (Signed)
Primary Physician: Primary Cardiologist:   HPI: Patient presents to ER with CP  Histroy is a little difficult He says that Mon/Tues he developed SSCP  Burning  Radiated to R arm  R arm felt like shocks occurring.  Took usual meds for reflux  Didn't help.  Took nexium yesterday  Got worse.  Couldn't get comfortable  Laying on floor. Came to ER   Patient also notes L flank pain this week  Has a history of kiney stones  Felt like having problem again. Denies SOB  Has been active until this week  Plays basketball has 4 kids.           Past Medical History  Diagnosis Date  . Migraine   . Hypertension   . Renal disorder      (Not in a hospital admission)      Infusions: . heparin 1,200 Units/hr (05/21/13 1147)    No Known Allergies  History   Social History  . Marital Status: Single    Spouse Name: N/A    Number of Children: N/A  . Years of Education: N/A   Occupational History  . Not on file.   Social History Main Topics  . Smoking status: Current Every Day Smoker -- 0.75 packs/day    Types: Cigarettes  . Smokeless tobacco: Never Used  . Alcohol Use: No  . Drug Use: Yes    Special: Marijuana  . Sexual Activity: Yes   Other Topics Concern  . Not on file   Social History Narrative  . No narrative on file    No family history on file.  REVIEW OF SYSTEMS:  All systems reviewed  Negative to the above problem except as noted above.    PHYSICAL EXAM: Filed Vitals:   05/21/13 1205  BP: 168/70  Pulse: 56  Temp:   Resp: 19    No intake or output data in the 24 hours ending 05/21/13 1243  General:  Well appearing. No respiratory difficulty HEENT: normal Neck: supple. no JVD. Carotids 2+ bilat; no bruits. No lymphadenopathy or thryomegaly appreciated. Cor: PMI nondisplaced. Regular rate & rhythm. No rubs, gallops or murmurs. Lungs: clear Abdomen: soft, nontender, nondistended. No hepatosplenomegaly. No bruits or masses. Good bowel  sounds. Extremities: no cyanosis, clubbing, rash, edema Neuro: alert & oriented x 3, cranial nerves grossly intact. moves all 4 extremities w/o difficulty. Affect pleasant.  ECG:  SB 58  T wave inversion II, III, AVF, V4-V6.    Results for orders placed during the hospital encounter of 05/21/13 (from the past 24 hour(s))  CBC WITH DIFFERENTIAL     Status: Abnormal   Collection Time    05/21/13 10:25 AM      Result Value Ref Range   WBC 10.9 (*) 4.0 - 10.5 K/uL   RBC 5.33  4.22 - 5.81 MIL/uL   Hemoglobin 15.9  13.0 - 17.0 g/dL   HCT 16.1  09.6 - 04.5 %   MCV 84.6  78.0 - 100.0 fL   MCH 29.8  26.0 - 34.0 pg   MCHC 35.3  30.0 - 36.0 g/dL   RDW 40.9  81.1 - 91.4 %   Platelets 233  150 - 400 K/uL   Neutrophils Relative % 70  43 - 77 %   Neutro Abs 7.6  1.7 - 7.7 K/uL   Lymphocytes Relative 19  12 - 46 %   Lymphs Abs 2.1  0.7 - 4.0 K/uL   Monocytes Relative 9  3 -  12 %   Monocytes Absolute 1.0  0.1 - 1.0 K/uL   Eosinophils Relative 2  0 - 5 %   Eosinophils Absolute 0.2  0.0 - 0.7 K/uL   Basophils Relative 0  0 - 1 %   Basophils Absolute 0.0  0.0 - 0.1 K/uL  COMPREHENSIVE METABOLIC PANEL     Status: Abnormal   Collection Time    05/21/13 10:25 AM      Result Value Ref Range   Sodium 140  137 - 147 mEq/L   Potassium 4.2  3.7 - 5.3 mEq/L   Chloride 102  96 - 112 mEq/L   CO2 24  19 - 32 mEq/L   Glucose, Bld 104 (*) 70 - 99 mg/dL   BUN 7  6 - 23 mg/dL   Creatinine, Ser 8.650.94  0.50 - 1.35 mg/dL   Calcium 9.5  8.4 - 78.410.5 mg/dL   Total Protein 7.5  6.0 - 8.3 g/dL   Albumin 4.1  3.5 - 5.2 g/dL   AST 71 (*) 0 - 37 U/L   ALT 25  0 - 53 U/L   Alkaline Phosphatase 72  39 - 117 U/L   Total Bilirubin 0.6  0.3 - 1.2 mg/dL   GFR calc non Af Amer >90  >90 mL/min   GFR calc Af Amer >90  >90 mL/min  LIPASE, BLOOD     Status: None   Collection Time    05/21/13 10:25 AM      Result Value Ref Range   Lipase 30  11 - 59 U/L  TROPONIN I     Status: Abnormal   Collection Time    05/21/13  10:25 AM      Result Value Ref Range   Troponin I 11.74 (*) <0.30 ng/mL  D-DIMER, QUANTITATIVE     Status: Abnormal   Collection Time    05/21/13 10:25 AM      Result Value Ref Range   D-Dimer, Quant 0.70 (*) 0.00 - 0.48 ug/mL-FEU  APTT     Status: None   Collection Time    05/21/13 10:25 AM      Result Value Ref Range   aPTT 32  24 - 37 seconds  PROTIME-INR     Status: None   Collection Time    05/21/13 10:25 AM      Result Value Ref Range   Prothrombin Time 13.0  11.6 - 15.2 seconds   INR 1.00  0.00 - 1.49   Dg Chest 2 View  05/21/2013   CLINICAL DATA:  Chest pain  EXAM: CHEST  2 VIEW  COMPARISON:  05/26/2007  FINDINGS: Cardiac enlargement. Cardiac silhouette appears larger compared with the prior study. Vascularity is normal. Lungs are clear without infiltrate effusion or mass.  IMPRESSION: Cardiac enlargement.  No acute abnormality.   Electronically Signed   By: Marlan Palauharles  Clark M.D.   On: 05/21/2013 10:59     ASSESSMENT:  [age] yo with histoyr of HTN (not treated), tobacco use ("Forever") presents with 3 day history of CP and arm discomfort.  Somewhat atypical  Not relieved by motrin or antacids.   On exam, BP is increased.  No rub.  EKG with T wave inversion inferior and laterally  Labs signif for Trop of 11.  Recom  Agree with heparin, aspirin,  Would start NTG and MSO$ Would repeat trop I would recomm L heart cath to define anatomy.  Patient is reflecting  Explained risks and benefits.  He is anxious.  Would check urine drug screen.   2,  HTN  Follow and treat  3.  GERD  IV protonix  4.  HCM  Check lipid panel  Counselled on tobacco

## 2013-05-21 NOTE — ED Notes (Signed)
Pt given urinal and made aware of need for urine specimen 

## 2013-05-21 NOTE — H&P (View-Only) (Signed)
Primary Physician: Primary Cardiologist:   HPI: Patient presents to ER with CP  Histroy is a little difficult He says that Mon/Tues he developed SSCP  Burning  Radiated to R arm  R arm felt like shocks occurring.  Took usual meds for reflux  Didn't help.  Took nexium yesterday  Got worse.  Couldn't get comfortable  Laying on floor. Came to ER   Patient also notes L flank pain this week  Has a history of kiney stones  Felt like having problem again. Denies SOB  Has been active until this week  Plays basketball has 4 kids.           Past Medical History  Diagnosis Date  . Migraine   . Hypertension   . Renal disorder      (Not in a hospital admission)      Infusions: . heparin 1,200 Units/hr (05/21/13 1147)    No Known Allergies  History   Social History  . Marital Status: Single    Spouse Name: N/A    Number of Children: N/A  . Years of Education: N/A   Occupational History  . Not on file.   Social History Main Topics  . Smoking status: Current Every Day Smoker -- 0.75 packs/day    Types: Cigarettes  . Smokeless tobacco: Never Used  . Alcohol Use: No  . Drug Use: Yes    Special: Marijuana  . Sexual Activity: Yes   Other Topics Concern  . Not on file   Social History Narrative  . No narrative on file    No family history on file.  REVIEW OF SYSTEMS:  All systems reviewed  Negative to the above problem except as noted above.    PHYSICAL EXAM: Filed Vitals:   05/21/13 1205  BP: 168/70  Pulse: 56  Temp:   Resp: 19    No intake or output data in the 24 hours ending 05/21/13 1243  General:  Well appearing. No respiratory difficulty HEENT: normal Neck: supple. no JVD. Carotids 2+ bilat; no bruits. No lymphadenopathy or thryomegaly appreciated. Cor: PMI nondisplaced. Regular rate & rhythm. No rubs, gallops or murmurs. Lungs: clear Abdomen: soft, nontender, nondistended. No hepatosplenomegaly. No bruits or masses. Good bowel  sounds. Extremities: no cyanosis, clubbing, rash, edema Neuro: alert & oriented x 3, cranial nerves grossly intact. moves all 4 extremities w/o difficulty. Affect pleasant.  ECG:  SB 58  T wave inversion II, III, AVF, V4-V6.    Results for orders placed during the hospital encounter of 05/21/13 (from the past 24 hour(s))  CBC WITH DIFFERENTIAL     Status: Abnormal   Collection Time    05/21/13 10:25 AM      Result Value Ref Range   WBC 10.9 (*) 4.0 - 10.5 K/uL   RBC 5.33  4.22 - 5.81 MIL/uL   Hemoglobin 15.9  13.0 - 17.0 g/dL   HCT 16.1  09.6 - 04.5 %   MCV 84.6  78.0 - 100.0 fL   MCH 29.8  26.0 - 34.0 pg   MCHC 35.3  30.0 - 36.0 g/dL   RDW 40.9  81.1 - 91.4 %   Platelets 233  150 - 400 K/uL   Neutrophils Relative % 70  43 - 77 %   Neutro Abs 7.6  1.7 - 7.7 K/uL   Lymphocytes Relative 19  12 - 46 %   Lymphs Abs 2.1  0.7 - 4.0 K/uL   Monocytes Relative 9  3 -  12 %   Monocytes Absolute 1.0  0.1 - 1.0 K/uL   Eosinophils Relative 2  0 - 5 %   Eosinophils Absolute 0.2  0.0 - 0.7 K/uL   Basophils Relative 0  0 - 1 %   Basophils Absolute 0.0  0.0 - 0.1 K/uL  COMPREHENSIVE METABOLIC PANEL     Status: Abnormal   Collection Time    05/21/13 10:25 AM      Result Value Ref Range   Sodium 140  137 - 147 mEq/L   Potassium 4.2  3.7 - 5.3 mEq/L   Chloride 102  96 - 112 mEq/L   CO2 24  19 - 32 mEq/L   Glucose, Bld 104 (*) 70 - 99 mg/dL   BUN 7  6 - 23 mg/dL   Creatinine, Ser 8.650.94  0.50 - 1.35 mg/dL   Calcium 9.5  8.4 - 78.410.5 mg/dL   Total Protein 7.5  6.0 - 8.3 g/dL   Albumin 4.1  3.5 - 5.2 g/dL   AST 71 (*) 0 - 37 U/L   ALT 25  0 - 53 U/L   Alkaline Phosphatase 72  39 - 117 U/L   Total Bilirubin 0.6  0.3 - 1.2 mg/dL   GFR calc non Af Amer >90  >90 mL/min   GFR calc Af Amer >90  >90 mL/min  LIPASE, BLOOD     Status: None   Collection Time    05/21/13 10:25 AM      Result Value Ref Range   Lipase 30  11 - 59 U/L  TROPONIN I     Status: Abnormal   Collection Time    05/21/13  10:25 AM      Result Value Ref Range   Troponin I 11.74 (*) <0.30 ng/mL  D-DIMER, QUANTITATIVE     Status: Abnormal   Collection Time    05/21/13 10:25 AM      Result Value Ref Range   D-Dimer, Quant 0.70 (*) 0.00 - 0.48 ug/mL-FEU  APTT     Status: None   Collection Time    05/21/13 10:25 AM      Result Value Ref Range   aPTT 32  24 - 37 seconds  PROTIME-INR     Status: None   Collection Time    05/21/13 10:25 AM      Result Value Ref Range   Prothrombin Time 13.0  11.6 - 15.2 seconds   INR 1.00  0.00 - 1.49   Dg Chest 2 View  05/21/2013   CLINICAL DATA:  Chest pain  EXAM: CHEST  2 VIEW  COMPARISON:  05/26/2007  FINDINGS: Cardiac enlargement. Cardiac silhouette appears larger compared with the prior study. Vascularity is normal. Lungs are clear without infiltrate effusion or mass.  IMPRESSION: Cardiac enlargement.  No acute abnormality.   Electronically Signed   By: Marlan Palauharles  Clark M.D.   On: 05/21/2013 10:59     ASSESSMENT:  [age] yo with histoyr of HTN (not treated), tobacco use ("Forever") presents with 3 day history of CP and arm discomfort.  Somewhat atypical  Not relieved by motrin or antacids.   On exam, BP is increased.  No rub.  EKG with T wave inversion inferior and laterally  Labs signif for Trop of 11.  Recom  Agree with heparin, aspirin,  Would start NTG and MSO$ Would repeat trop I would recomm L heart cath to define anatomy.  Patient is reflecting  Explained risks and benefits.  He is anxious.  Would check urine drug screen.   2,  HTN  Follow and treat  3.  GERD  IV protonix  4.  HCM  Check lipid panel  Counselled on tobacco

## 2013-05-21 NOTE — Progress Notes (Addendum)
ANTICOAGULATION CONSULT NOTE - Initial Consult  Pharmacy Consult for IV Heparin  Indication: ACS/STEMI  No Known Allergies  Patient Measurements:   Heparin Dosing Weight:   Vital Signs: Temp: 98.3 F (36.8 C) (02/27 1007) Temp src: Oral (02/27 1007) BP: 152/91 mmHg (02/27 1016)  Labs:  Recent Labs  05/21/13 1025  HGB 15.9  HCT 45.1  PLT 233  CREATININE 0.94  TROPONINI 11.Ricardo*    The CrCl is unknown because both a height and weight (above a minimum accepted value) are required for this calculation.   Medical History: Past Medical History  Diagnosis Date  . Migraine   . Hypertension   . Renal disorder     Medications:  Scheduled:  . aspirin  324 mg Oral Once   Infusions:  . heparin     PRN:   Assessment:  33 yo Ricardo [name] with STEMI starting IV heparin per pharmacy.    SCr 0.94 - noted hx of 'renal disorder' on MD note  CBC okay, no issues or hx of bleeding  Actual body weight = 98 kg; Heparin dosing weight =  86 kg  ER MD ordered 4000 unit bolus x 1, will also order baseline aPTT and PT/INR, may be after bolus given when drawn, will f/u.   Goal of Therapy:  Heparin level 0.3-0.7 units/ml Monitor platelets by anticoagulation protocol: Yes   Plan:  1.) Stat aPTT/INR 2.) Heparin 4000 unit bolus x  1, then start 1200 units/hr 3.) Heparin level 6 hours after gtt starts (1800) 4.) Daily Heparin level and CBC   Abdiel Blackerby, Loma Messing PharmD Pager #: [phone] 11:27 AM 05/21/2013

## 2013-05-21 NOTE — Progress Notes (Signed)
[facility] Stacy, provided pt with a list of primary care resources and a [facility] [employer] application to help patient establish primary care.  °

## 2013-05-21 NOTE — ED Notes (Signed)
[facility] here to provide transport to [facility], verbal report provided to [facility] staff.  Nad upon leaving dept.

## 2013-05-22 DIAGNOSIS — I2510 Atherosclerotic heart disease of native coronary artery without angina pectoris: Secondary | ICD-10-CM

## 2013-05-22 DIAGNOSIS — I214 Non-ST elevation (NSTEMI) myocardial infarction: Secondary | ICD-10-CM

## 2013-05-22 DIAGNOSIS — I2589 Other forms of chronic ischemic heart disease: Secondary | ICD-10-CM

## 2013-05-22 DIAGNOSIS — I10 Essential (primary) hypertension: Secondary | ICD-10-CM

## 2013-05-22 DIAGNOSIS — R079 Chest pain, unspecified: Secondary | ICD-10-CM

## 2013-05-22 DIAGNOSIS — I517 Cardiomegaly: Secondary | ICD-10-CM

## 2013-05-22 LAB — BASIC METABOLIC PANEL
BUN: 7 mg/dL (ref 6–23)
CALCIUM: 9.6 mg/dL (ref 8.4–10.5)
CHLORIDE: 100 meq/L (ref 96–112)
CO2: 23 mEq/L (ref 19–32)
Creatinine, Ser: 0.99 mg/dL (ref 0.50–1.35)
GFR calc non Af Amer: >90 mL/min (ref >90)
Glucose, Bld: 78 mg/dL (ref 70–99)
Potassium: 3.9 mEq/L (ref 3.7–5.3)
Sodium: 138 mEq/L (ref 137–147)

## 2013-05-22 LAB — CBC
HCT: 42.3 % (ref 39.0–52.0)
Hemoglobin: 15.3 g/dL (ref 13.0–17.0)
MCH: 30.7 pg (ref 26.0–34.0)
MCHC: 36.2 g/dL — ABNORMAL HIGH (ref 30.0–36.0)
MCV: 84.9 fL (ref 78.0–100.0)
Platelets: 213 10*3/uL (ref 150–400)
RBC: 4.98 MIL/uL (ref 4.22–5.81)
RDW: 13.6 % (ref 11.5–15.5)
WBC: 11.2 10*3/uL — ABNORMAL HIGH (ref 4.0–10.5)

## 2013-05-22 LAB — HEPARIN LEVEL (UNFRACTIONATED)
HEPARIN UNFRACTIONATED: 0.2 [IU]/mL — AB (ref 0.30–0.70)
HEPARIN UNFRACTIONATED: 0.23 [IU]/mL — AB (ref 0.30–0.70)
HEPARIN UNFRACTIONATED: 0.6 [IU]/mL (ref 0.30–0.70)

## 2013-05-22 MED ORDER — HEPARIN (PORCINE) IN NACL 100-0.45 UNIT/ML-% IJ SOLN
1500.0000 [IU]/h | INTRAMUSCULAR | Status: DC
  Administered 2013-05-23: 1650 [IU]/h via INTRAVENOUS
  Administered 2013-05-23: 1700 [IU]/h via INTRAVENOUS
  Administered 2013-05-25: 1500 [IU]/h via INTRAVENOUS
  Filled 2013-05-22 (×5): qty 250

## 2013-05-22 MED ORDER — LISINOPRIL 10 MG PO TABS
10.0000 mg | ORAL_TABLET | Freq: Every day | ORAL | Status: DC
  Administered 2013-05-22 – 2013-05-24 (×3): 10 mg via ORAL
  Filled 2013-05-22 (×4): qty 1

## 2013-05-22 MED ORDER — SPIRONOLACTONE 12.5 MG HALF TABLET
12.5000 mg | ORAL_TABLET | Freq: Every day | ORAL | Status: DC
  Administered 2013-05-22 – 2013-05-23 (×2): 12.5 mg via ORAL
  Filled 2013-05-22 (×2): qty 1

## 2013-05-22 NOTE — Progress Notes (Signed)
Patient ID: Ricardo [name], male   DOB: [birth year], 33 y.o.   MRN: [id number]   SUBJECTIVE: No further chest pain.  Has headache from NTG.  Scheduled Meds: . aspirin  81 mg Oral Daily  . atorvastatin  80 mg Oral q1800  . carvedilol  6.25 mg Oral BID WC  . lisinopril  10 mg Oral Daily  . pantoprazole  40 mg Oral Daily  . spironolactone  12.5 mg Oral Daily   Continuous Infusions: . heparin 1,400 Units/hr (05/22/13 1049)   PRN Meds:.acetaminophen, ALPRAZolam, morphine injection    Filed Vitals:   05/22/13 0000 05/22/13 0400 05/22/13 0742 05/22/13 1202  BP: 122/64 112/56 146/66 173/99  Pulse: 73 78 85 77  Temp:  97.8 F (36.6 C) 99.1 F (37.3 C) 98.7 F (37.1 C)  TempSrc:  Oral Oral Oral  Resp: 20 18 16    Height:      Weight:      SpO2: 99% 96% 100% 99%    Intake/Output Summary (Last 24 hours) at 05/22/13 1348 Last data filed at 05/22/13 1100  Gross per 24 hour  Intake 1328.22 ml  Output    750 ml  Net 578.22 ml    LABS: Basic Metabolic Panel:  Recent Labs  40/98/11 1025  NA 140  K 4.2  CL 102  CO2 24  GLUCOSE 104*  BUN 7  CREATININE 0.94  CALCIUM 9.5   Liver Function Tests:  Recent Labs  05/21/13 1025  AST 71*  ALT 25  ALKPHOS 72  BILITOT 0.6  PROT 7.5  ALBUMIN 4.1    Recent Labs  05/21/13 1025  LIPASE 30   CBC:  Recent Labs  05/21/13 1025 05/22/13 0322  WBC 10.9* 11.2*  NEUTROABS 7.6  --   HGB 15.9 15.3  HCT 45.1 42.3  MCV 84.6 84.9  PLT 233 213   Cardiac Enzymes:  Recent Labs  05/21/13 1025 05/21/13 1335  TROPONINI 11.74* 10.88*   BNP: No components found with this basename: POCBNP,  D-Dimer:  Recent Labs  05/21/13 1025  DDIMER 0.70*   Hemoglobin A1C: No results found for this basename: HGBA1C,  in the last 72 hours Fasting Lipid Panel: No results found for this basename: CHOL, HDL, LDLCALC, TRIG, CHOLHDL, LDLDIRECT,  in the last 72 hours Thyroid Function Tests: No results found for this basename: TSH,  T4TOTAL, FREET3, T3FREE, THYROIDAB,  in the last 72 hours Anemia Panel: No results found for this basename: VITAMINB12, FOLATE, FERRITIN, TIBC, IRON, RETICCTPCT,  in the last 72 hours  RADIOLOGY: Dg Chest 2 View  05/21/2013   CLINICAL DATA:  Chest pain  EXAM: CHEST  2 VIEW  COMPARISON:  05/26/2007  FINDINGS: Cardiac enlargement. Cardiac silhouette appears larger compared with the prior study. Vascularity is normal. Lungs are clear without infiltrate effusion or mass.  IMPRESSION: Cardiac enlargement.  No acute abnormality.   Electronically Signed   By: Marlan Palau M.D.   On: 05/21/2013 10:59    PHYSICAL EXAM General: NAD Neck: No JVD, no thyromegaly or thyroid nodule.  Lungs: Clear to auscultation bilaterally with normal respiratory effort. CV: Nondisplaced PMI.  Heart regular S1/S2, +S4, no murmur.  No peripheral edema.  No carotid bruit.  Normal pedal pulses.  Abdomen: Soft, nontender, no hepatosplenomegaly, no distention.  Neurologic: Alert and oriented x 3.  Psych: Normal affect. Extremities: No clubbing or cyanosis.   TELEMETRY: Reviewed telemetry pt in NSR  ASSESSMENT AND PLAN: 33 yo smoker with history of HTN presented yesterday  with NSTEMI.  LHC showed 3vd with EF 30-35% by LV-gram. Father had MI in his early 1550s.  1. CAD: 3VD, needs CABG.  No further chest pain.   - Will make sure CVTS is coming to see him.  - Can turn off NTG (causing headache).  - Continue ASA, statin, and heparin gtt - Needs to quit smoking.  2. Ischemic cardiomyopathy: EF 30-35% by LV-gram.   - Confirm EF with echo. - Continue Coreg - Add lisinopril 10 mg daily and spironolactone 12.5 mg daily (BP is high).  - Follow BMET.   Marca AnconaDalton Aydrien Froman 05/22/2013 1:51 PM

## 2013-05-22 NOTE — Progress Notes (Signed)
ANTICOAGULATION CONSULT NOTE - Follow Up Consult  Pharmacy Consult for Heparin Indication: CAD awaiting CABG  No Known Allergies  Patient Measurements: Height: 5\' 7"  (170.2 cm) Weight: 221 lb 5.5 oz (100.4 kg) IBW/kg (Calculated) : 66.1 Heparin Dosing Weight: ~88kg  Vital Signs: Temp: 98.7 F (37.1 C) (02/28 1202) Temp src: Oral (02/28 1202) BP: 173/99 mmHg (02/28 1202) Pulse Rate: 77 (02/28 1202)  Labs:  Recent Labs  05/21/13 1025 05/21/13 1335 05/22/13 0322 05/22/13 0913 05/22/13 1550  HGB 15.9  --  15.3  --   --   HCT 45.1  --  42.3  --   --   PLT 233  --  213  --   --   APTT 32  --   --   --   --   LABPROT 13.0  --   --   --   --   INR 1.00  --   --   --   --   HEPARINUNFRC  --   --   --  0.20* 0.23*  CREATININE 0.94  --   --   --   --   TROPONINI 11.74* 10.88*  --   --   --     Estimated Creatinine Clearance: 125 ml/min (by C-G formula based on Cr of 0.94).   Medications:  Heparin @ 1400 units/hr  Assessment: 32yom s/p cath found to have severe 3v obstructive CAD. He continues on heparin awaiting CABG. Heparin level remains below goal despite rate increase this morning.   Goal of Therapy:  Heparin level 0.3-0.7 units/ml Monitor platelets by anticoagulation protocol: Yes   Plan:  1) Increase heparin to 1700 units/hr 2) Re-check heparin level 6 hours after rate increase  Fredrik Rigger 05/22/2013,4:34 PM

## 2013-05-22 NOTE — Progress Notes (Signed)
  Echocardiogram 2D Echocardiogram has been performed.  Holley Wirt 05/22/2013, 12:01 PM

## 2013-05-22 NOTE — Progress Notes (Signed)
Phase I cardiac rehab  Pt and family pre open heart surgery education completed.  Pt instructed in Incentive spirometer use, sternal precautions.  Understanding verbalized

## 2013-05-22 NOTE — Progress Notes (Signed)
ANTICOAGULATION CONSULT NOTE - Initial Consult  Pharmacy Consult for IV Heparin  Indication: ACS/STEMI  No Known Allergies  Patient Measurements: Height: 5\' 7"  (170.2 cm) Weight: 221 lb 5.5 oz (100.4 kg) IBW/kg (Calculated) : 66.1 Heparin dosing weight =81kg  Vital Signs: Temp: 99.1 F (37.3 C) (02/28 0742) Temp src: Oral (02/28 0742) BP: 146/66 mmHg (02/28 0742) Pulse Rate: 85 (02/28 0742)  Labs:  Recent Labs  05/21/13 1025 05/21/13 1335 05/22/13 0322 05/22/13 0913  HGB 15.9  --  15.3  --   HCT 45.1  --  42.3  --   PLT 233  --  213  --   APTT 32  --   --   --   LABPROT 13.0  --   --   --   INR 1.00  --   --   --   HEPARINUNFRC  --   --   --  0.20*  CREATININE 0.94  --   --   --   TROPONINI 11.74* 10.88*  --   --     Estimated Creatinine Clearance: 125 ml/min (by C-G formula based on Cr of 0.94).   Medical History: Past Medical History  Diagnosis Date  . Migraine   . Hypertension   . Renal disorder     Medications:  Scheduled:  . aspirin  81 mg Oral Daily  . atorvastatin  80 mg Oral q1800  . carvedilol  6.25 mg Oral BID WC  . pantoprazole  40 mg Oral Daily   Infusions:  . heparin 1,200 Units/hr (05/22/13 0024)  . nitroGLYCERIN 5 mcg/min (05/21/13 1730)   PRN:   Assessment: 33 yo who presented w/ central chest pain x3d. Now s/p L heart cath 2/27 found w/ 3 ves occlusion of RCA and LCx> now requiring CABG. Patient is on heparin for ACS/STEMI, HL returned subtherapeutic, with no interruptions in drip. Patient CBC is stable and no signs of bleed noted.  Goal of Therapy:  Heparin level 0.3-0.7 units/ml Monitor platelets by anticoagulation protocol: Yes   Plan:  Adjust heparin at 1400 units/hr and recheck HL at 1600 F/u daily HL and CBC Monitor signs of bleed  Kebra Lowrimore M. Allena Katz, PharmD Clinical Pharmacist- Resident Pager: [phone] Pharmacy: [phone] 05/22/2013 10:06 AM

## 2013-05-22 NOTE — Consult Note (Signed)
Cardiothoracic Surgery Consut  Reason for Consult: Severe multi-vessel coronary disease s/p NSTEMI Referring Physician: Dr. Peter Martinique   HPI:   33 year old gentleman with a history of HTN, strong family history of premature coronary disease, and smoking who reports a many year history of heart burn type pain and belching that he thought was due to reflux. On Monday and Tuesday of this week he developed substernal chest pain with radiation to the right arm and hand that he has never had before. It worsened and he came to the ER where troponin was positive at 11.74. Cardiac cath shows severe 3-vessel coronary disease with 100% occlusion of the RCA and mid LCX with left to right collaterals filling the distal RCA and bridging collaterals filling the distal LCX OM. There is a large Ramus with a 90% stenosis. The LAD has 50-70% proximal stenosis and the first diagonal has 90% ostial stenosis. The LVEF is 30-35% with global hypokinesis and severe hypokinesis of the anterior wall.    Past Medical History  Diagnosis Date  . Migraine   . Hypertension   . Renal disorder     History reviewed. No pertinent past surgical history.  Family history: Father had MI in his early 46's  Social History:  reports that he has been smoking Cigarettes.  He has been smoking about 0.75 packs per day. He has never used smokeless tobacco. He reports that he uses illicit drugs (Marijuana). He reports that he does not drink alcohol.  Allergies: No Known Allergies  Medications:  I have reviewed the patient's current medications. Prior to Admission:  Prescriptions prior to admission  Medication Sig Dispense Refill  . naproxen sodium (ANAPROX) 220 MG tablet Take 440 mg by mouth 3 (three) times daily.      . ranitidine (ZANTAC) 150 MG tablet Take 300 mg by mouth 2 (two) times daily.       Scheduled: . aspirin  81 mg Oral Daily  . atorvastatin  80 mg Oral q1800  . carvedilol  6.25 mg Oral BID WC  . lisinopril   10 mg Oral Daily  . pantoprazole  40 mg Oral Daily  . spironolactone  12.5 mg Oral Daily   Continuous: . heparin 1,700 Units/hr (05/22/13 1708)   CBS:[id number], ALPRAZolam, morphine injection Anti-infectives   None      Results for orders placed during the hospital encounter of 05/21/13 (from the past 48 hour(s))  CBC WITH DIFFERENTIAL     Status: Abnormal   Collection Time    05/21/13 10:25 AM      Result Value Ref Range   WBC 10.9 (*) 4.0 - 10.5 K/uL   RBC 5.33  4.22 - 5.81 MIL/uL   Hemoglobin 15.9  13.0 - 17.0 g/dL   HCT 45.1  39.0 - 52.0 %   MCV 84.6  78.0 - 100.0 fL   MCH 29.8  26.0 - 34.0 pg   MCHC 35.3  30.0 - 36.0 g/dL   RDW 13.5  11.5 - 15.5 %   Platelets 233  150 - 400 K/uL   Neutrophils Relative % 70  43 - 77 %   Neutro Abs 7.6  1.7 - 7.7 K/uL   Lymphocytes Relative 19  12 - 46 %   Lymphs Abs 2.1  0.7 - 4.0 K/uL   Monocytes Relative 9  3 - 12 %   Monocytes Absolute 1.0  0.1 - 1.0 K/uL   Eosinophils Relative 2  0 - 5 %   Eosinophils  Absolute 0.2  0.0 - 0.7 K/uL   Basophils Relative 0  0 - 1 %   Basophils Absolute 0.0  0.0 - 0.1 K/uL  COMPREHENSIVE METABOLIC PANEL     Status: Abnormal   Collection Time    05/21/13 10:25 AM      Result Value Ref Range   Sodium 140  137 - 147 mEq/L   Potassium 4.2  3.7 - 5.3 mEq/L   Chloride 102  96 - 112 mEq/L   CO2 24  19 - 32 mEq/L   Glucose, Bld 104 (*) 70 - 99 mg/dL   BUN 7  6 - 23 mg/dL   Creatinine, Ser 0.94  0.50 - 1.35 mg/dL   Calcium 9.5  8.4 - 10.5 mg/dL   Total Protein 7.5  6.0 - 8.3 g/dL   Albumin 4.1  3.5 - 5.2 g/dL   AST 71 (*) 0 - 37 U/L   ALT 25  0 - 53 U/L   Alkaline Phosphatase 72  39 - 117 U/L   Total Bilirubin 0.6  0.3 - 1.2 mg/dL   GFR calc non Af Amer >90  >90 mL/min   GFR calc Af Amer >90  >90 mL/min   Comment: (NOTE)     The eGFR has been calculated using the CKD EPI equation.     This calculation has not been validated in all clinical situations.     eGFR's persistently <90 mL/min  signify possible Chronic Kidney     Disease.  LIPASE, BLOOD     Status: None   Collection Time    05/21/13 10:25 AM      Result Value Ref Range   Lipase 30  11 - 59 U/L  TROPONIN I     Status: Abnormal   Collection Time    05/21/13 10:25 AM      Result Value Ref Range   Troponin I 11.74 (*) <0.30 ng/mL   Comment:            Due to the release kinetics of cTnI,     a negative result within the first hours     of the onset of symptoms does not rule out     myocardial infarction with certainty.     If myocardial infarction is still suspected,     repeat the test at appropriate intervals.     CRITICAL RESULT CALLED TO, READ BACK BY AND VERIFIED WITH:     GUERTIN,D RN AT 1115 02.27.15 BY TIBBITTS,K  D-DIMER, QUANTITATIVE     Status: Abnormal   Collection Time    05/21/13 10:25 AM      Result Value Ref Range   D-Dimer, Quant 0.70 (*) 0.00 - 0.48 ug/mL-FEU   Comment:            AT THE INHOUSE ESTABLISHED CUTOFF     VALUE OF 0.48 ug/mL FEU,     THIS ASSAY HAS BEEN DOCUMENTED     IN THE LITERATURE TO HAVE     A SENSITIVITY AND NEGATIVE     PREDICTIVE VALUE OF AT LEAST     98 TO 99%.  THE TEST RESULT     SHOULD BE CORRELATED WITH     AN ASSESSMENT OF THE CLINICAL     PROBABILITY OF DVT / VTE.  APTT     Status: None   Collection Time    05/21/13 10:25 AM      Result Value Ref Range   aPTT 32  24 -  37 seconds  PROTIME-INR     Status: None   Collection Time    05/21/13 10:25 AM      Result Value Ref Range   Prothrombin Time 13.0  11.6 - 15.2 seconds   INR 1.00  0.00 - 1.49  TROPONIN I     Status: Abnormal   Collection Time    05/21/13  1:35 PM      Result Value Ref Range   Troponin I 10.88 (*) <0.30 ng/mL   Comment:            Due to the release kinetics of cTnI,     a negative result within the first hours     of the onset of symptoms does not rule out     myocardial infarction with certainty.     If myocardial infarction is still suspected,     repeat the test at  appropriate intervals.     CRITICAL VALUE NOTED.  VALUE IS CONSISTENT WITH PREVIOUSLY REPORTED AND CALLED VALUE.  URINE RAPID DRUG SCREEN (HOSP PERFORMED)     Status: Abnormal   Collection Time    05/21/13  3:02 PM      Result Value Ref Range   Opiates POSITIVE (*) NONE DETECTED   Cocaine NONE DETECTED  NONE DETECTED   Benzodiazepines NONE DETECTED  NONE DETECTED   Amphetamines NONE DETECTED  NONE DETECTED   Tetrahydrocannabinol POSITIVE (*) NONE DETECTED   Barbiturates NONE DETECTED  NONE DETECTED   Comment:            DRUG SCREEN FOR MEDICAL PURPOSES     ONLY.  IF CONFIRMATION IS NEEDED     FOR ANY PURPOSE, NOTIFY LAB     WITHIN 5 DAYS.                LOWEST DETECTABLE LIMITS     FOR URINE DRUG SCREEN     Drug Class       Cutoff (ng/mL)     Amphetamine      1000     Barbiturate      200     Benzodiazepine   448     Tricyclics       185     Opiates          300     Cocaine          300     THC              50  MRSA PCR SCREENING     Status: None   Collection Time    05/21/13  5:23 PM      Result Value Ref Range   MRSA by PCR NEGATIVE  NEGATIVE   Comment:            The GeneXpert MRSA Assay (FDA     approved for NASAL specimens     only), is one component of a     comprehensive MRSA colonization     surveillance program. It is not     intended to diagnose MRSA     infection nor to guide or     monitor treatment for     MRSA infections.  CBC     Status: Abnormal   Collection Time    05/22/13  3:22 AM      Result Value Ref Range   WBC 11.2 (*) 4.0 - 10.5 K/uL   RBC 4.98  4.22 - 5.81 MIL/uL   Hemoglobin  15.3  13.0 - 17.0 g/dL   HCT 42.3  39.0 - 52.0 %   MCV 84.9  78.0 - 100.0 fL   MCH 30.7  26.0 - 34.0 pg   MCHC 36.2 (*) 30.0 - 36.0 g/dL   RDW 13.6  11.5 - 15.5 %   Platelets 213  150 - 400 K/uL  HEPARIN LEVEL (UNFRACTIONATED)     Status: Abnormal   Collection Time    05/22/13  9:13 AM      Result Value Ref Range   Heparin Unfractionated 0.20 (*) 0.30 - 0.70  IU/mL   Comment:            IF HEPARIN RESULTS ARE BELOW     EXPECTED VALUES, AND PATIENT     DOSAGE HAS BEEN CONFIRMED,     SUGGEST FOLLOW UP TESTING     OF ANTITHROMBIN III LEVELS.  HEPARIN LEVEL (UNFRACTIONATED)     Status: Abnormal   Collection Time    05/22/13  3:50 PM      Result Value Ref Range   Heparin Unfractionated 0.23 (*) 0.30 - 0.70 IU/mL   Comment:            IF HEPARIN RESULTS ARE BELOW     EXPECTED VALUES, AND PATIENT     DOSAGE HAS BEEN CONFIRMED,     SUGGEST FOLLOW UP TESTING     OF ANTITHROMBIN III LEVELS.  BASIC METABOLIC PANEL     Status: None   Collection Time    05/22/13  3:50 PM      Result Value Ref Range   Sodium 138  137 - 147 mEq/L   Potassium 3.9  3.7 - 5.3 mEq/L   Chloride 100  96 - 112 mEq/L   CO2 23  19 - 32 mEq/L   Glucose, Bld 78  70 - 99 mg/dL   BUN 7  6 - 23 mg/dL   Creatinine, Ser 0.99  0.50 - 1.35 mg/dL   Calcium 9.6  8.4 - 10.5 mg/dL   GFR calc non Af Amer >90  >90 mL/min   GFR calc Af Amer >90  >90 mL/min   Comment: (NOTE)     The eGFR has been calculated using the CKD EPI equation.     This calculation has not been validated in all clinical situations.     eGFR's persistently <90 mL/min signify possible Chronic Kidney     Disease.    Dg Chest 2 View  05/21/2013   CLINICAL DATA:  Chest pain  EXAM: CHEST  2 VIEW  COMPARISON:  05/26/2007  FINDINGS: Cardiac enlargement. Cardiac silhouette appears larger compared with the prior study. Vascularity is normal. Lungs are clear without infiltrate effusion or mass.  IMPRESSION: Cardiac enlargement.  No acute abnormality.   Electronically Signed   By: Franchot Gallo M.D.   On: 05/21/2013 10:59    Review of Systems  Constitutional: Positive for malaise/fatigue. Negative for fever, chills, weight loss and diaphoresis.  HENT: Negative.   Eyes: Negative.   Respiratory: Positive for shortness of breath. Negative for cough and sputum production.   Cardiovascular: Positive for chest pain.  Negative for palpitations, orthopnea, leg swelling and PND.  Gastrointestinal: Positive for heartburn. Negative for nausea, vomiting, abdominal pain, diarrhea and constipation.  Genitourinary: Negative.   Musculoskeletal: Negative.   Skin: Negative.   Neurological: Negative.   Endo/Heme/Allergies: Negative.   Psychiatric/Behavioral: Positive for substance abuse.       Smokes marijuana   Blood pressure 158/80,  pulse 83, temperature 98.8 F (37.1 C), temperature source Oral, resp. rate 16, height _0  (1.702 m), weight 100.4 kg (221 lb 5.5 oz), SpO2 98.00%. Physical Exam  Constitutional: He is oriented to person, place, and time. He appears well-developed and well-nourished. No distress.  HENT:  Head: Normocephalic and atraumatic.  Mouth/Throat: Oropharynx is clear and moist.  Eyes: EOM are normal. Pupils are equal, round, and reactive to light.  Neck: Normal range of motion. Neck supple. No JVD present. No thyromegaly present.  Cardiovascular: Normal rate, regular rhythm, normal heart sounds and intact distal pulses.  Exam reveals no gallop and no friction rub.   No murmur heard. Respiratory: Effort normal and breath sounds normal. No respiratory distress. He has no rales.  GI: Soft. Bowel sounds are normal. He exhibits no distension and no mass. There is no tenderness.  Musculoskeletal: He exhibits no edema.  Lymphadenopathy:    He has no cervical adenopathy.  Neurological: He is alert and oriented to person, place, and time. He has normal strength. No cranial nerve deficit or sensory deficit.  Skin: Skin is warm and dry.  Psychiatric: He has a normal mood and affect.    Cardiac Catheterization Procedure Note  Name: Ricardo [name]  MRN: [id number]  DOB: 1981-01-26  Procedure: Left Heart Cath, Selective Coronary Angiography, LV angiography  Indication: 33 yo BM with history of HTN and tobacco abuse presents with a NSTEMI.  Procedural Details: The right wrist was prepped, draped,  and anesthetized with 1% lidocaine. Using the modified Seldinger technique, a 5 French sheath was introduced into the right radial artery. 3 mg of verapamil was administered through the sheath, weight-based unfractionated heparin was administered intravenously. Standard Judkins catheters were used for selective coronary angiography and left ventriculography. Catheter exchanges were performed over an exchange length guidewire. There were no immediate procedural complications. A TR band was used for radial hemostasis at the completion of the procedure. The patient was transferred to the post catheterization recovery area for further monitoring.  Procedural Findings:  Hemodynamics:  AO 117/80 mean 98 mm Hg  LV 120/17 mm Hg  Coronary angiography:  Coronary dominance: right  Left mainstem: Normal.  Left anterior descending (LAD): There is segmental 50-70% disease in the proximal LAD. The first diagonal has a 90% ostial stenosis then is subtotally occluded in the mid vessel.  Ramus intermediate: This is a very large vessel with 90% ostial stenosis.  Left circumflex (LCx): The LCx has diffuse 70% disease in the proximal vessel and then is occluded in the mid vessel. There are extensive bridging collaterals to the distal vessel with a large OM.  Right coronary artery (RCA): The RCA is occluded in the mid vessel with right to right and left to right collaterals.  Left ventriculography: Left ventricular systolic function is abnormal, there is global hypokinesis with severe hypokinesis of the anterior wall. LVEF is estimated at 30-35%. There is mild to moderate mitral regurgitation  Final Conclusions:  1. Severe 3 vessel obstructive CAD. The RCA and LCx occlusions appear chronic.  2. Severe LV dysfunction.  Recommendations: I reviewed the films with Dr. Irish Lack. Options for PCI are very limited with 2 major vessels with CTOs. Given LV dysfunction he needs complete revascularization and I recommend CABG. Will  transfer to stepdown. Resume IV heparin post sheath removal. Continue ASA, IV Ntg, beta blockers, statin. Will check Echo.  Collier Salina [facility]  05/21/2013, 5:04 PM   *[facility]* [address],  [place] [address] [phone]  ------------------------------------------------------------ Transthoracic Echocardiography  Patient: Ricardo [name], Ricardo [name] MR #: [id number] Study Date: 05/22/2013 Gender: M Age: 62 Height: 170.2cm Weight: 99.8kg BSA: 2.45m2 Pt. Status: Room: 2[place] ORDERING JMartinique PRyanJMartinique PHoliday ShoresSONOGRAPHER BWyatt Mage RDCS PERFORMING [facility], Inpatient cc:  ------------------------------------------------------------ LV EF: 35% - 40%  ------------------------------------------------------------ Indications: MI - acute 410.91.  ------------------------------------------------------------ History: Risk factors: Current tobacco use. Hypertension.  ------------------------------------------------------------ Study Conclusions  - Left ventricle: The cavity size was normal. Wall thickness was increased in a pattern of mild LVH. Systolic function was moderately reduced. The estimated ejection fraction was in the range of 35% to 40%. There is akinesis of the basal-midinferior myocardium. There is severe hypokinesis of the midanterolateral myocardium. Doppler parameters are consistent with abnormal left ventricular relaxation (grade 1 diastolic dysfunction). - Mitral valve: Mildly thickened leaflets . Trivial regurgitation. - Left atrium: The atrium was mildly dilated. - Right atrium: Central venous pressure: 314mHg (est). - Tricuspid valve: Trivial regurgitation. - Pulmonary arteries: Systolic pressure could not be accurately estimated. - Pericardium, extracardiac: There was no pericardial effusion. Impressions:  - Mild LVH with LVEF 35-40%, wall motion  abnormalties consistent with ischemic cardiomyopathy, grade 1 diastolic dysfunction. Mild left atrial enlargement. Trivial mitral and tricuspid regurgitation. Unable to assess PASP. Transthoracic echocardiography. M-mode, complete 2D, spectral Doppler, and color Doppler. Height: Height: 170.2cm. Height: 67in. Weight: Weight: 99.8kg. Weight: 219.5lb. Body mass index: BMI: 34.5kg/m^2. Body surface area: BSA: 2.1136m Blood pressure: 146/66. Patient status: Inpatient. Location: ICU/CCU  ------------------------------------------------------------  ------------------------------------------------------------ Left ventricle: The cavity size was normal. Wall thickness was increased in a pattern of mild LVH. Systolic function was moderately reduced. The estimated ejection fraction was in the range of 35% to 40%. Regional wall motion abnormalities: There is akinesis of the basal-midinferior myocardium. There is severe hypokinesis of the midanterolateral myocardium. Doppler parameters are consistent with abnormal left ventricular relaxation (grade 1 diastolic dysfunction).  ------------------------------------------------------------ Aortic valve: Trileaflet. Cusp separation was normal. Doppler: No significant regurgitation.  ------------------------------------------------------------ Aorta: Aortic root: The aortic root was normal in size.  ------------------------------------------------------------ Mitral valve: Mildly thickened leaflets . Leaflet separation was normal. Doppler: Trivial regurgitation.  ------------------------------------------------------------ Left atrium: The atrium was mildly dilated.  ------------------------------------------------------------ Right ventricle: The cavity size was normal. Systolic function was normal.  ------------------------------------------------------------ Pulmonic valve: The valve appears to be grossly normal. Doppler: No significant  regurgitation.  ------------------------------------------------------------ Tricuspid valve: The valve appears to be grossly normal. Doppler: Trivial regurgitation.  ------------------------------------------------------------ Pulmonary artery: Systolic pressure could not be accurately estimated.  ------------------------------------------------------------ Right atrium: The atrium was normal in size.  ------------------------------------------------------------ Pericardium: There was no pericardial effusion.  ------------------------------------------------------------ Systemic veins: Inferior vena cava: The vessel was normal in size; the respirophasic diameter changes were in the normal range (= 50%); findings are consistent with normal central venous pressure.  ------------------------------------------------------------  2D measurements Normal Doppler measurements Normal Left ventricle Left ventricle LVID ED, 56.3 mm 43-52 Ea, lat ann, 7.6 cm/s ------ chord, tiss DP 2 PLAX E/Ea, lat 7.6 ------ LVID ES, 43.7 mm 23-38 ann, tiss DP chord, Ea, med ann, 6.6 cm/s ------ PLAX tiss DP 4 FS, chord, 22 % >29 E/Ea, med 8.7 ------ PLAX ann, tiss DP 2 LVPW, ED 10.7 mm ------ Mitral valve IVS/LVPW 1.11 <1.3 Peak E vel 57. cm/s ------ ratio, ED 9 Ventricular septum Peak A vel 65. cm/s ------ IVS, ED 11.9 mm ------ 5 Aorta Deceleration 271 ms 150-23 Root diam, 34 mm ------ time 0 ED Peak  E/A 0.9 ------ Left atrium ratio AP dim 39 mm ------ Systemic veins AP dim 1.85 cm/m^2 <2.2 Estimated CVP 3 mm ------ index Hg Vol, S 69.7 ml ------ Right ventricle Vol index, 33 ml/m^2 ------ Sa vel, lat 12. cm/s ------ S ann, tiss DP 7  ------------------------------------------------------------ Prepared and Electronically Authenticated by  Rozann Lesches [id number]:[id number]    Assessment/Plan:  He has severe multi-vessel coronary disease with moderate LV dysfunction s/p  acute NSTEMI. I agree that CABG is the best treatment for this patient to improve his symptoms and prevent further ischemia and infarction. I discussed the operative procedure with the patient and family including alternatives, benefits and risks; including but not limited to bleeding, blood transfusion, infection, stroke, myocardial infarction, graft failure, heart block requiring a permanent pacemaker, organ dysfunction, and death.  Ricardo [name] understands and agrees to proceed.  We will schedule surgery for next week but I will need to review to schedule to see when there is an opening.  BARTLE,BRYAN K 05/22/2013, 7:01 PM

## 2013-05-23 LAB — CBC
HCT: 43.2 % (ref 39.0–52.0)
HEMOGLOBIN: 15.8 g/dL (ref 13.0–17.0)
MCH: 30.7 pg (ref 26.0–34.0)
MCHC: 36.6 g/dL — ABNORMAL HIGH (ref 30.0–36.0)
MCV: 84 fL (ref 78.0–100.0)
Platelets: 175 10*3/uL (ref 150–400)
RBC: 5.14 MIL/uL (ref 4.22–5.81)
RDW: 13.6 % (ref 11.5–15.5)
WBC: 10.4 10*3/uL (ref 4.0–10.5)

## 2013-05-23 LAB — BASIC METABOLIC PANEL
BUN: 8 mg/dL (ref 6–23)
CO2: 22 mEq/L (ref 19–32)
Calcium: 9.2 mg/dL (ref 8.4–10.5)
Chloride: 103 mEq/L (ref 96–112)
Creatinine, Ser: 1 mg/dL (ref 0.50–1.35)
GFR calc Af Amer: >90 mL/min (ref >90)
Glucose, Bld: 119 mg/dL — ABNORMAL HIGH (ref 70–99)
Potassium: 3.7 mEq/L (ref 3.7–5.3)
SODIUM: 141 meq/L (ref 137–147)

## 2013-05-23 LAB — HEPARIN LEVEL (UNFRACTIONATED): HEPARIN UNFRACTIONATED: 0.65 [IU]/mL (ref 0.30–0.70)

## 2013-05-23 MED ORDER — SPIRONOLACTONE 12.5 MG HALF TABLET
12.5000 mg | ORAL_TABLET | Freq: Once | ORAL | Status: AC
  Administered 2013-05-23: 12.5 mg via ORAL
  Filled 2013-05-23: qty 1

## 2013-05-23 MED ORDER — SPIRONOLACTONE 25 MG PO TABS
25.0000 mg | ORAL_TABLET | Freq: Every day | ORAL | Status: DC
  Administered 2013-05-24: 25 mg via ORAL
  Filled 2013-05-23 (×2): qty 1

## 2013-05-23 MED ORDER — CARVEDILOL 12.5 MG PO TABS
12.5000 mg | ORAL_TABLET | Freq: Two times a day (BID) | ORAL | Status: DC
  Administered 2013-05-23 – 2013-05-24 (×3): 12.5 mg via ORAL
  Filled 2013-05-23 (×7): qty 1

## 2013-05-23 NOTE — Progress Notes (Signed)
ANTICOAGULATION CONSULT NOTE - Follow Up Consult  Pharmacy Consult for heparin Indication: CAD awaiting CABG  Labs:  Recent Labs  05/21/13 1025 05/21/13 1335 05/22/13 0322 05/22/13 0913 05/22/13 1550 05/22/13 2230  HGB 15.9  --  15.3  --   --   --   HCT 45.1  --  42.3  --   --   --   PLT 233  --  213  --   --   --   APTT 32  --   --   --   --   --   LABPROT 13.0  --   --   --   --   --   INR 1.00  --   --   --   --   --   HEPARINUNFRC  --   --   --  0.20* 0.23* 0.60  CREATININE 0.94  --   --   --  0.99  --   TROPONINI 11.74* 10.88*  --   --   --   --     Assessment/Plan:  33yo male now therapeutic on heparin after rate increases.  Will continue gtt at current rate and confirm stable with am labs.  Vernard Gambles, PharmD, BCPS  05/23/2013,12:39 AM

## 2013-05-23 NOTE — Progress Notes (Signed)
ANTICOAGULATION CONSULT NOTE - Follow Up Consult  Pharmacy Consult for Heparin Indication: CAD awaiting CABG  No Known Allergies  Patient Measurements: Height: 5\' 7"  (170.2 cm) Weight: 221 lb 5.5 oz (100.4 kg) IBW/kg (Calculated) : 66.1 Heparin Dosing Weight: ~88kg  Vital Signs: Temp: 98.5 F (36.9 C) (03/01 0822) Temp src: Oral (03/01 0822) BP: 134/64 mmHg (03/01 0822) Pulse Rate: 93 (03/01 0400)  Labs:  Recent Labs  05/21/13 1025 05/21/13 1335 05/22/13 0322  05/22/13 1550 05/22/13 2230 05/23/13 0352  HGB 15.9  --  15.3  --   --   --  15.8  HCT 45.1  --  42.3  --   --   --  43.2  PLT 233  --  213  --   --   --  175  APTT 32  --   --   --   --   --   --   LABPROT 13.0  --   --   --   --   --   --   INR 1.00  --   --   --   --   --   --   HEPARINUNFRC  --   --   --   < > 0.23* 0.60 0.65  CREATININE 0.94  --   --   --  0.99  --  1.00  TROPONINI 11.74* 10.88*  --   --   --   --   --   < > = values in this interval not displayed.  Estimated Creatinine Clearance: 117.5 ml/min (by C-G formula based on Cr of 1).   Medications:  Heparin @ 1400 units/hr  Assessment: 32yom s/p cath found to have severe 3v obstructive CAD. He continues on heparin awaiting CABG. HL therapeutic and trending up, will adjust only slightly to prevent continual elevation. No signs of bleed.  Goal of Therapy:  Heparin level 0.3-0.7 units/ml Monitor platelets by anticoagulation protocol: Yes   Plan:  1) Adjust heparin to 1650 units/hr 2) Check HL and CBC daily 3) Monitor for signs of bleed.  Allena Katz, Nilton Lave M 05/23/2013,10:49 AM

## 2013-05-23 NOTE — Progress Notes (Signed)
Patient ID: Ricardo [name], male   DOB: 07-04-1980, 33 y.o.   MRN: [id number]    SUBJECTIVE: No further chest pain.  Doing well today.   Scheduled Meds: . aspirin  81 mg Oral Daily  . atorvastatin  80 mg Oral q1800  . carvedilol  12.5 mg Oral BID WC  . lisinopril  10 mg Oral Daily  . pantoprazole  40 mg Oral Daily  . spironolactone  12.5 mg Oral Once  . [START ON 05/24/2013] spironolactone  25 mg Oral Daily   Continuous Infusions: . heparin 1,650 Units/hr (05/23/13 1103)   PRN Meds:.acetaminophen, ALPRAZolam, morphine injection    Filed Vitals:   05/23/13 0000 05/23/13 0400 05/23/13 0822 05/23/13 1233  BP: 126/70 148/67 134/64 150/99  Pulse: 82 93    Temp:  99 F (37.2 C) 98.5 F (36.9 C) 98.6 F (37 C)  TempSrc:  Oral Oral Oral  Resp:   18   Height:      Weight:      SpO2:  97% 98% 100%    Intake/Output Summary (Last 24 hours) at 05/23/13 1307 Last data filed at 05/23/13 1200  Gross per 24 hour  Intake 781.81 ml  Output   1400 ml  Net -618.19 ml    LABS: Basic Metabolic Panel:  Recent Labs  03/26/7200/28/15 1550 05/23/13 0352  NA 138 141  K 3.9 3.7  CL 100 103  CO2 23 22  GLUCOSE 78 119*  BUN 7 8  CREATININE 0.99 1.00  CALCIUM 9.6 9.2   Liver Function Tests:  Recent Labs  05/21/13 1025  AST 71*  ALT 25  ALKPHOS 72  BILITOT 0.6  PROT 7.5  ALBUMIN 4.1    Recent Labs  05/21/13 1025  LIPASE 30   CBC:  Recent Labs  05/21/13 1025 05/22/13 0322 05/23/13 0352  WBC 10.9* 11.2* 10.4  NEUTROABS 7.6  --   --   HGB 15.9 15.3 15.8  HCT 45.1 42.3 43.2  MCV 84.6 84.9 84.0  PLT 233 213 175   Cardiac Enzymes:  Recent Labs  05/21/13 1025 05/21/13 1335  TROPONINI 11.74* 10.88*   BNP: No components found with this basename: POCBNP,  D-Dimer:  Recent Labs  05/21/13 1025  DDIMER 0.70*   Hemoglobin A1C: No results found for this basename: HGBA1C,  in the last 72 hours Fasting Lipid Panel: No results found for this basename: CHOL, HDL,  LDLCALC, TRIG, CHOLHDL, LDLDIRECT,  in the last 72 hours Thyroid Function Tests: No results found for this basename: TSH, T4TOTAL, FREET3, T3FREE, THYROIDAB,  in the last 72 hours Anemia Panel: No results found for this basename: VITAMINB12, FOLATE, FERRITIN, TIBC, IRON, RETICCTPCT,  in the last 72 hours  RADIOLOGY: Dg Chest 2 View  05/21/2013   CLINICAL DATA:  Chest pain  EXAM: CHEST  2 VIEW  COMPARISON:  05/26/2007  FINDINGS: Cardiac enlargement. Cardiac silhouette appears larger compared with the prior study. Vascularity is normal. Lungs are clear without infiltrate effusion or mass.  IMPRESSION: Cardiac enlargement.  No acute abnormality.   Electronically Signed   By: Marlan Palauharles  Clark M.D.   On: 05/21/2013 10:59    PHYSICAL EXAM General: NAD Neck: No JVD, no thyromegaly or thyroid nodule.  Lungs: Clear to auscultation bilaterally with normal respiratory effort. CV: Nondisplaced PMI.  Heart regular S1/S2, +S4, no murmur.  No peripheral edema.  No carotid bruit.  Normal pedal pulses.  Abdomen: Soft, nontender, no hepatosplenomegaly, no distention.  Neurologic: Alert and oriented x  3.  Psych: Normal affect. Extremities: No clubbing or cyanosis.   TELEMETRY: Reviewed telemetry pt in NSR  ASSESSMENT AND PLAN: 33 yo smoker with history of HTN presented yesterday with NSTEMI.  LHC showed 3vd with EF 30-35% by LV-gram. Father had MI in his early 24s.  1. CAD: 3VD, needs CABG.  No further chest pain.   - Plan for CABG this week, waiting to be scheduled. - Continue ASA, statin, and heparin gtt - Needs to quit smoking.  2. Ischemic cardiomyopathy: EF 30-35% by LV-gram, 35-40% by echo.   - With elevated BP, will increase Coreg to 12.5 mg bid and increase spironolactone to 25 mg daily.  Continue current lisinopril.   Marca Ancona 05/23/2013 1:07 PM

## 2013-05-24 ENCOUNTER — Inpatient Hospital Stay (HOSPITAL_COMMUNITY): Payer: Self-pay

## 2013-05-24 ENCOUNTER — Other Ambulatory Visit: Payer: Self-pay | Admitting: *Deleted

## 2013-05-24 DIAGNOSIS — Z01810 Encounter for preprocedural cardiovascular examination: Secondary | ICD-10-CM

## 2013-05-24 DIAGNOSIS — Z951 Presence of aortocoronary bypass graft: Secondary | ICD-10-CM

## 2013-05-24 DIAGNOSIS — I2510 Atherosclerotic heart disease of native coronary artery without angina pectoris: Secondary | ICD-10-CM

## 2013-05-24 HISTORY — DX: Presence of aortocoronary bypass graft: Z95.1

## 2013-05-24 LAB — POCT I-STAT 3, ART BLOOD GAS (G3+)
Acid-base deficit: 1 mmol/L (ref 0.0–2.0)
Bicarbonate: 23.9 mEq/L (ref 20.0–24.0)
O2 SAT: 97 %
Patient temperature: 98.6
TCO2: 25 mmol/L (ref 0–100)
pCO2 arterial: 37.7 mmHg (ref 35.0–45.0)
pH, Arterial: 7.409 (ref 7.350–7.450)
pO2, Arterial: 89 mmHg (ref 80.0–100.0)

## 2013-05-24 LAB — HEPARIN LEVEL (UNFRACTIONATED)
Heparin Unfractionated: 0.78 IU/mL — ABNORMAL HIGH (ref 0.30–0.70)
Heparin Unfractionated: 0.52 IU/mL (ref 0.30–0.70)

## 2013-05-24 LAB — BASIC METABOLIC PANEL
BUN: 10 mg/dL (ref 6–23)
CO2: 24 mEq/L (ref 19–32)
Calcium: 9.2 mg/dL (ref 8.4–10.5)
Chloride: 99 mEq/L (ref 96–112)
Creatinine, Ser: 1.03 mg/dL (ref 0.50–1.35)
GFR calc Af Amer: >90 mL/min (ref >90)
GFR calc non Af Amer: >90 mL/min (ref >90)
GLUCOSE: 108 mg/dL — AB (ref 70–99)
POTASSIUM: 3.5 meq/L — AB (ref 3.7–5.3)
SODIUM: 135 meq/L — AB (ref 137–147)

## 2013-05-24 LAB — CBC
HCT: 41.7 % (ref 39.0–52.0)
HEMOGLOBIN: 14.7 g/dL (ref 13.0–17.0)
MCH: 30 pg (ref 26.0–34.0)
MCHC: 35.3 g/dL (ref 30.0–36.0)
MCV: 85.1 fL (ref 78.0–100.0)
Platelets: 217 10*3/uL (ref 150–400)
RBC: 4.9 MIL/uL (ref 4.22–5.81)
RDW: 13.6 % (ref 11.5–15.5)
WBC: 9.7 10*3/uL (ref 4.0–10.5)

## 2013-05-24 LAB — TYPE AND SCREEN
ABO/RH(D): O NEG
Antibody Screen: NEGATIVE

## 2013-05-24 LAB — PULMONARY FUNCTION TEST
FEF 25-75 POST: 4.02 L/s
FEF 25-75 PRE: 4.17 L/s
FEF2575-%Change-Post: -3 %
FEF2575-%PRED-PRE: 112 %
FEF2575-%Pred-Post: 108 %
FEV1-%Change-Post: 6 %
FEV1-%Pred-Post: 100 %
FEV1-%Pred-Pre: 94 %
FEV1-Post: 3.37 L
FEV1-Pre: 3.17 L
FEV1FVC-%Change-Post: 0 %
FEV1FVC-%Pred-Pre: 106 %
FEV6-%CHANGE-POST: 6 %
FEV6-%Pred-Post: 96 %
FEV6-%Pred-Pre: 90 %
FEV6-POST: 3.82 L
FEV6-Pre: 3.6 L
FEV6FVC-%CHANGE-POST: 0 %
FEV6FVC-%PRED-POST: 102 %
FEV6FVC-%Pred-Pre: 102 %
FVC-%CHANGE-POST: 6 %
FVC-%PRED-PRE: 89 %
FVC-%Pred-Post: 94 %
FVC-PRE: 3.6 L
FVC-Post: 3.82 L
PRE FEV1/FVC RATIO: 88 %
Post FEV1/FVC ratio: 88 %
Post FEV6/FVC ratio: 100 %
Pre FEV6/FVC Ratio: 100 %

## 2013-05-24 LAB — ABO/RH: ABO/RH(D): O NEG

## 2013-05-24 MED ORDER — DEXTROSE 5 % IV SOLN
1.5000 g | INTRAVENOUS | Status: AC
  Administered 2013-05-25: 1.5 g via INTRAVENOUS
  Administered 2013-05-25: .75 g via INTRAVENOUS
  Filled 2013-05-24: qty 1.5

## 2013-05-24 MED ORDER — PLASMA-LYTE 148 IV SOLN
INTRAVENOUS | Status: AC
  Administered 2013-05-25: 09:00:00
  Filled 2013-05-24: qty 2.5

## 2013-05-24 MED ORDER — MAGNESIUM SULFATE 50 % IJ SOLN
40.0000 meq | INTRAMUSCULAR | Status: DC
Start: 2013-05-25 — End: 2013-05-25
  Filled 2013-05-24: qty 10

## 2013-05-24 MED ORDER — ALPRAZOLAM 0.25 MG PO TABS: 0.2500 mg | ORAL_TABLET | ORAL | Status: DC | PRN

## 2013-05-24 MED ORDER — CHLORHEXIDINE GLUCONATE CLOTH 2 % EX PADS
6.0000 | MEDICATED_PAD | Freq: Once | CUTANEOUS | Status: AC
  Administered 2013-05-25: 6 via TOPICAL

## 2013-05-24 MED ORDER — POTASSIUM CHLORIDE CRYS ER 20 MEQ PO TBCR
40.0000 meq | EXTENDED_RELEASE_TABLET | Freq: Once | ORAL | Status: AC
  Administered 2013-05-24: 40 meq via ORAL
  Filled 2013-05-24: qty 2

## 2013-05-24 MED ORDER — SODIUM CHLORIDE 0.9 % IV SOLN
INTRAVENOUS | Status: AC
  Administered 2013-05-25: 69.8 mL/h via INTRAVENOUS
  Filled 2013-05-24: qty 40

## 2013-05-24 MED ORDER — SODIUM CHLORIDE 0.9 % IV SOLN
INTRAVENOUS | Status: AC
  Administered 2013-05-25: 1.2 [IU]/h via INTRAVENOUS
  Filled 2013-05-24: qty 1

## 2013-05-24 MED ORDER — BISACODYL 5 MG PO TBEC
5.0000 mg | DELAYED_RELEASE_TABLET | Freq: Once | ORAL | Status: AC
  Administered 2013-05-24: 5 mg via ORAL
  Filled 2013-05-24: qty 1

## 2013-05-24 MED ORDER — DOPAMINE-DEXTROSE 3.2-5 MG/ML-% IV SOLN
2.0000 ug/kg/min | INTRAVENOUS | Status: DC
  Filled 2013-05-24: qty 250

## 2013-05-24 MED ORDER — POTASSIUM CHLORIDE 2 MEQ/ML IV SOLN
80.0000 meq | INTRAVENOUS | Status: DC
Start: 2013-05-25 — End: 2013-05-25
  Filled 2013-05-24: qty 40

## 2013-05-24 MED ORDER — VANCOMYCIN HCL 10 G IV SOLR
1500.0000 mg | INTRAVENOUS | Status: AC
  Administered 2013-05-25: 1500 mg via INTRAVENOUS
  Filled 2013-05-24: qty 1500

## 2013-05-24 MED ORDER — EPINEPHRINE HCL 1 MG/ML IJ SOLN
0.5000 ug/min | INTRAVENOUS | Status: DC
  Filled 2013-05-24: qty 4

## 2013-05-24 MED ORDER — SODIUM CHLORIDE 0.9 % IV SOLN
INTRAVENOUS | Status: DC
  Filled 2013-05-24: qty 30

## 2013-05-24 MED ORDER — DIAZEPAM 5 MG PO TABS
10.0000 mg | ORAL_TABLET | Freq: Once | ORAL | Status: AC
  Administered 2013-05-25: 10 mg via ORAL
  Filled 2013-05-24: qty 2

## 2013-05-24 MED ORDER — CHLORHEXIDINE GLUCONATE CLOTH 2 % EX PADS
6.0000 | MEDICATED_PAD | Freq: Once | CUTANEOUS | Status: AC
Start: 2013-05-24 — End: 2013-05-24
  Administered 2013-05-24: 6 via TOPICAL

## 2013-05-24 MED ORDER — PHENYLEPHRINE HCL 10 MG/ML IJ SOLN
30.0000 ug/min | INTRAVENOUS | Status: DC
  Administered 2013-05-25: 25 ug/min via INTRAVENOUS
  Filled 2013-05-24: qty 2

## 2013-05-24 MED ORDER — METOPROLOL TARTRATE 12.5 MG HALF TABLET
12.5000 mg | ORAL_TABLET | Freq: Once | ORAL | Status: AC
Start: 2013-05-25 — End: 2013-05-25
  Administered 2013-05-25: 12.5 mg via ORAL
  Filled 2013-05-24: qty 1

## 2013-05-24 MED ORDER — DEXMEDETOMIDINE HCL IN NACL 400 MCG/100ML IV SOLN
0.1000 ug/kg/h | INTRAVENOUS | Status: AC
  Administered 2013-05-25: 0.3 ug/kg/h via INTRAVENOUS
  Filled 2013-05-24: qty 100

## 2013-05-24 MED ORDER — TEMAZEPAM 15 MG PO CAPS
15.0000 mg | ORAL_CAPSULE | Freq: Once | ORAL | Status: AC | PRN
  Administered 2013-05-24: 15 mg via ORAL
  Filled 2013-05-24: qty 1

## 2013-05-24 MED ORDER — DEXTROSE 5 % IV SOLN
750.0000 mg | INTRAVENOUS | Status: DC
  Filled 2013-05-24: qty 750

## 2013-05-24 MED ORDER — ALBUTEROL SULFATE (2.5 MG/3ML) 0.083% IN NEBU: 2.5000 mg | INHALATION_SOLUTION | Freq: Once | RESPIRATORY_TRACT | Status: DC

## 2013-05-24 MED ORDER — NITROGLYCERIN IN D5W 200-5 MCG/ML-% IV SOLN
2.0000 ug/min | INTRAVENOUS | Status: AC
Start: 2013-05-25 — End: 2013-05-25
  Administered 2013-05-25: 10 ug/min via INTRAVENOUS
  Filled 2013-05-24: qty 250

## 2013-05-24 NOTE — Progress Notes (Signed)
CARDIAC REHAB PHASE I   PRE:  Rate/Rhythm: SR 72  BP:  Supine:   Sitting: 126/79  Standing:    SaO2: 98 RA  MODE:  Ambulation: 500 ft   POST:  Rate/Rhythm: SR 76  BP:  Supine: Sitting: 120/76  Standing:    SaO2: 100 RA Pt finishing PFT at the bedside.  Pt ambulated x 1 assist in hallway.  Pt tolerated well with no complaints.  Pt to side of bed with family at bedside.  Reviewed with pt the importance of early ambulation post surgery.  Pt verbalized understanding. Ricardo [name], BSN [phone]  Ricardo [name], Ricardo [name]

## 2013-05-24 NOTE — Progress Notes (Addendum)
Pre-op Cardiac Surgery  Carotid Findings:  Bilateral:  1-39% ICA stenosis.  Vertebral artery flow is antegrade.    Farrel Demark, RDMS, RVT 05/24/2013    Upper Extremity Right Left  Brachial Pressures 135 Triphasic  131 Triphasic   Radial Waveforms Triphasic  Triphasic   Ulnar Waveforms Triphasic  Triphasic   Palmar Arch (Allen's Test) Within normal limits  Within normal limits    [facility], RVT 05/24/2013 3:33 PM

## 2013-05-24 NOTE — Care Management Note (Addendum)
    Page 1 of 1   05/27/2013     11:56:41 AM   CARE MANAGEMENT NOTE 05/27/2013  Patient:  Ricardo [name], Ricardo [name]   Account Number:  [id number]  Date Initiated:  05/24/2013  Documentation initiated by:  Junius Creamer  Subjective/Objective Assessment:   adm w mi     Action/Plan:   lives w fam   Per UR Regulation:  Reviewed for med. necessity/level of care/duration of stay  If discussed at Long Length of Stay Meetings, dates discussed:   05/27/2013   Comments:  05/27/13 1131 Montserrat Shek RN MSN BSN CCM Pt has no insurance or PCP, would like to follow @ [employer] and [employer].  Pt will qualify for [facility] program when discharged. 1149 Pt has appointment with Cardiology Clinic @ [facility] on Wednesday, March 11th @ 12 p.m.  Information provided to pt and significant other.

## 2013-05-24 NOTE — Progress Notes (Signed)
3 Days Post-Op Procedure(s) (LRB): LEFT HEART CATHETERIZATION WITH CORONARY ANGIOGRAM (N/A) Subjective: No chest pain or dyspnea  Objective: Vital signs in last 24 hours: Temp:  [97.8 F (36.6 C)-99.2 F (37.3 C)] 98.3 F (36.8 C) (03/02 1225) Cardiac Rhythm:  [-] Normal sinus rhythm (03/02 1225) Resp:  [16] 16 (03/02 0320) BP: (104-141)/(54-97) 132/71 mmHg (03/02 1225) SpO2:  [96 %-100 %] 100 % (03/02 1225)  Hemodynamic parameters for last 24 hours:    Intake/Output from previous day: 03/01 0701 - 03/02 0700 In: 1573.2 [P.O.:1180; I.V.:393.2] Out: 700 [Urine:700] Intake/Output this shift: Total I/O In: 825 [P.O.:720; I.V.:105] Out: 500 [Urine:500]  General appearance: alert and cooperative Heart: regular rate and rhythm, S1, S2 normal, no murmur, click, rub or gallop Lungs: clear to auscultation bilaterally  Lab Results:  Recent Labs  05/23/13 0352 05/24/13 0255  WBC 10.4 9.7  HGB 15.8 14.7  HCT 43.2 41.7  PLT 175 217   BMET:  Recent Labs  05/23/13 0352 05/24/13 0255  NA 141 135*  K 3.7 3.5*  CL 103 99  CO2 22 24  GLUCOSE 119* 108*  BUN 8 10  CREATININE 1.00 1.03  CALCIUM 9.2 9.2    PT/INR: No results found for this basename: LABPROT, INR,  in the last 72 hours ABG No results found for this basename: phart, pco2, po2, hco3, tco2, acidbasedef, o2sat   CBG (last 3)  No results found for this basename: GLUCAP,  in the last 72 hours  Assessment/Plan:   Severe multi-vessel coronary disease. Plan CABG tomorrow. I discussed the operative procedure with the patient and family including alternatives, benefits and risks; including but not limited to bleeding, blood transfusion, infection, stroke, myocardial infarction, graft failure, heart block requiring a permanent pacemaker, organ dysfunction, and death.  Elijio Miles understands and agrees to proceed.    Alleen Borne 05/24/2013

## 2013-05-24 NOTE — Progress Notes (Signed)
ANTICOAGULATION CONSULT NOTE - Follow Up Consult  Pharmacy Consult for heparin Indication: CAD awaiting CABG  Labs:  Recent Labs  05/21/13 1335  05/22/13 0322  05/22/13 1550  05/23/13 0352 05/24/13 0255 05/24/13 1000  HGB  --   < > 15.3  --   --   --  15.8 14.7  --   HCT  --   --  42.3  --   --   --  43.2 41.7  --   PLT  --   --  213  --   --   --  175 217  --   HEPARINUNFRC  --   --   --   < > 0.23*  < > 0.65 0.78* 0.52  CREATININE  --   --   --   --  0.99  --  1.00 1.03  --   TROPONINI 10.88*  --   --   --   --   --   --   --   --   < > = values in this interval not displayed.   Assessment: 33yo male s/p cath with 3V CAD. Patient is therapeutic on heparin (HL= 0.52) after decrease to 1500 units/hr. Patient noted for CABG this week.   Goal of Therapy:  Heparin level 0.3-0.7 units/ml   Plan:  -No heparin changes -Daily heparin level and CBC  Harland German, Pharm D 05/24/2013 12:38 PM

## 2013-05-24 NOTE — Progress Notes (Signed)
ANTICOAGULATION CONSULT NOTE - Follow Up Consult  Pharmacy Consult for heparin Indication: CAD awaiting CABG  Labs:  Recent Labs  05/21/13 1025 05/21/13 1335 05/22/13 0322  05/22/13 1550 05/22/13 2230 05/23/13 0352 05/24/13 0255  HGB 15.9  --  15.3  --   --   --  15.8 14.7  HCT 45.1  --  42.3  --   --   --  43.2 41.7  PLT 233  --  213  --   --   --  175 217  APTT 32  --   --   --   --   --   --   --   LABPROT 13.0  --   --   --   --   --   --   --   INR 1.00  --   --   --   --   --   --   --   HEPARINUNFRC  --   --   --   < > 0.23* 0.60 0.65 0.78*  CREATININE 0.94  --   --   --  0.99  --  1.00  --   TROPONINI 11.74* 10.88*  --   --   --   --   --   --   < > = values in this interval not displayed.   Assessment: 33yo male now supratherapeutic on heparin with levels trending up.  Goal of Therapy:  Heparin level 0.3-0.7 units/ml   Plan:  Will decrease heparin gtt by 1-2 units/kg/hr to 1500 units/hr and check level in 6hr.  Vernard Gambles, PharmD, BCPS  05/24/2013,3:48 AM

## 2013-05-24 NOTE — Progress Notes (Signed)
Patient ID: Ricardo [name], male   DOB: December 08, 1980, 33 y.o.   MRN: [id number]    SUBJECTIVE: No further chest pain or dyspnea  Scheduled Meds: . aspirin  81 mg Oral Daily  . atorvastatin  80 mg Oral q1800  . carvedilol  12.5 mg Oral BID WC  . lisinopril  10 mg Oral Daily  . pantoprazole  40 mg Oral Daily  . spironolactone  25 mg Oral Daily   Continuous Infusions: . heparin 1,500 Units/hr (05/24/13 0348)   PRN Meds:.acetaminophen, ALPRAZolam, morphine injection    Filed Vitals:   05/23/13 2321 05/24/13 0000 05/24/13 0320 05/24/13 0805  BP: 123/67  104/54 136/65  Pulse:      Temp:  98.2 F (36.8 C) 97.8 F (36.6 C) 97.9 F (36.6 C)  TempSrc:  Oral Oral Oral  Resp:   16   Height:      Weight:      SpO2:   97% 100%    Intake/Output Summary (Last 24 hours) at 05/24/13 1101 Last data filed at 05/24/13 0900  Gross per 24 hour  Intake 1692.23 ml  Output   1200 ml  Net 492.23 ml    LABS: Basic Metabolic Panel:  Recent Labs  41/96/22 0352 05/24/13 0255  NA 141 135*  K 3.7 3.5*  CL 103 99  CO2 22 24  GLUCOSE 119* 108*  BUN 8 10  CREATININE 1.00 1.03  CALCIUM 9.2 9.2   CBC:  Recent Labs  05/23/13 0352 05/24/13 0255  WBC 10.4 9.7  HGB 15.8 14.7  HCT 43.2 41.7  MCV 84.0 85.1  PLT 175 217   Cardiac Enzymes:  Recent Labs  05/21/13 1335  TROPONINI 10.88*    RADIOLOGY: Dg Chest 2 View  05/21/2013   CLINICAL DATA:  Chest pain  EXAM: CHEST  2 VIEW  COMPARISON:  05/26/2007  FINDINGS: Cardiac enlargement. Cardiac silhouette appears larger compared with the prior study. Vascularity is normal. Lungs are clear without infiltrate effusion or mass.  IMPRESSION: Cardiac enlargement.  No acute abnormality.   Electronically Signed   By: Marlan Palau M.D.   On: 05/21/2013 10:59    PHYSICAL EXAM General: NAD Neck: Supple Lungs: Clear to auscultation bilaterally . CV: RRR Abdomen: Soft, nontender, no hepatosplenomegaly, no distention.  Neurologic: Alert and  oriented x 3.  Extremities: No edema  TELEMETRY: Reviewed telemetry pt in NSR  ASSESSMENT AND PLAN: 33 yo smoker with history of HTN presented yesterday with NSTEMI.  LHC showed 3vd with EF 30-35% by LV-gram. Father had MI in his early 40s.  1. CAD: 3VD, needs CABG.  No further chest pain.   - Plan for CABG tomorrow - Continue ASA, statin, and heparin gtt - Needs to quit smoking.  2. Ischemic cardiomyopathy: EF 30-35% by LV-gram, 35-40% by echo.   - continue Coreg to 12.5 mg bid and spironolactone 25 mg daily.  Continue current lisinopril.  3. Tobacco abuse: patient counseled on discontinuing Olga Millers 05/24/2013 11:01 AM

## 2013-05-25 ENCOUNTER — Encounter (HOSPITAL_COMMUNITY): Payer: MEDICAID | Admitting: Certified Registered"

## 2013-05-25 ENCOUNTER — Inpatient Hospital Stay (HOSPITAL_COMMUNITY): Payer: Self-pay

## 2013-05-25 ENCOUNTER — Inpatient Hospital Stay (HOSPITAL_COMMUNITY): Payer: Self-pay | Admitting: Certified Registered"

## 2013-05-25 ENCOUNTER — Encounter (HOSPITAL_COMMUNITY)
Admission: EM | Disposition: A | Discharge status: Home / Self Care | Payer: Self-pay | Source: Home / Self Care | Attending: Internal Medicine

## 2013-05-25 ENCOUNTER — Encounter (HOSPITAL_COMMUNITY): Payer: Self-pay | Admitting: Certified Registered Nurse Anesthetist

## 2013-05-25 DIAGNOSIS — Z951 Presence of aortocoronary bypass graft: Secondary | ICD-10-CM

## 2013-05-25 DIAGNOSIS — I2510 Atherosclerotic heart disease of native coronary artery without angina pectoris: Secondary | ICD-10-CM

## 2013-05-25 HISTORY — PX: INTRAOPERATIVE TRANSESOPHAGEAL ECHOCARDIOGRAM: SHX5062

## 2013-05-25 HISTORY — PX: ENDOVEIN HARVEST OF GREATER SAPHENOUS VEIN: SHX5059

## 2013-05-25 HISTORY — PX: CORONARY ARTERY BYPASS GRAFT: SHX141

## 2013-05-25 LAB — POCT I-STAT 4, (NA,K, GLUC, HGB,HCT)
GLUCOSE: 143 mg/dL — AB (ref 70–99)
GLUCOSE: 126 mg/dL — AB (ref 70–99)
Glucose, Bld: 101 mg/dL — ABNORMAL HIGH (ref 70–99)
Glucose, Bld: 97 mg/dL (ref 70–99)
Glucose, Bld: 87 mg/dL (ref 70–99)
Glucose, Bld: 120 mg/dL — ABNORMAL HIGH (ref 70–99)
HCT: 44 % (ref 39.0–52.0)
HCT: 41 % (ref 39.0–52.0)
HCT: 33 % — ABNORMAL LOW (ref 39.0–52.0)
HCT: 40 % (ref 39.0–52.0)
HEMATOCRIT: 29 % — AB (ref 39.0–52.0)
HEMATOCRIT: 35 % — AB (ref 39.0–52.0)
HEMOGLOBIN: 9.9 g/dL — AB (ref 13.0–17.0)
Hemoglobin: 15 g/dL (ref 13.0–17.0)
Hemoglobin: 13.9 g/dL (ref 13.0–17.0)
Hemoglobin: 11.2 g/dL — ABNORMAL LOW (ref 13.0–17.0)
Hemoglobin: 11.9 g/dL — ABNORMAL LOW (ref 13.0–17.0)
Hemoglobin: 13.6 g/dL (ref 13.0–17.0)
POTASSIUM: 4.5 meq/L (ref 3.7–5.3)
POTASSIUM: 4.7 meq/L (ref 3.7–5.3)
POTASSIUM: 6 meq/L — AB (ref 3.7–5.3)
Potassium: 4.1 mEq/L (ref 3.7–5.3)
Potassium: 5.7 mEq/L — ABNORMAL HIGH (ref 3.7–5.3)
Potassium: 4.8 mEq/L (ref 3.7–5.3)
SODIUM: 137 meq/L (ref 137–147)
SODIUM: 129 meq/L — AB (ref 137–147)
SODIUM: 133 meq/L — AB (ref 137–147)
Sodium: 141 mEq/L (ref 137–147)
Sodium: 140 mEq/L (ref 137–147)
Sodium: 139 mEq/L (ref 137–147)

## 2013-05-25 LAB — CREATININE, SERUM
Creatinine, Ser: 1.05 mg/dL (ref 0.50–1.35)
GFR calc non Af Amer: >90 mL/min (ref >90)

## 2013-05-25 LAB — POCT I-STAT 3, ART BLOOD GAS (G3+)
Acid-base deficit: 2 mmol/L (ref 0.0–2.0)
Acid-base deficit: 2 mmol/L (ref 0.0–2.0)
Acid-base deficit: 3 mmol/L — ABNORMAL HIGH (ref 0.0–2.0)
BICARBONATE: 25.5 meq/L — AB (ref 20.0–24.0)
Bicarbonate: 23.4 mEq/L (ref 20.0–24.0)
Bicarbonate: 24.8 mEq/L — ABNORMAL HIGH (ref 20.0–24.0)
Bicarbonate: 23.2 mEq/L (ref 20.0–24.0)
O2 SAT: 100 %
O2 SAT: 98 %
O2 SAT: 98 %
O2 SAT: 95 %
PCO2 ART: 42.8 mmHg (ref 35.0–45.0)
PH ART: 7.295 — AB (ref 7.350–7.450)
PO2 ART: 118 mmHg — AB (ref 80.0–100.0)
Patient temperature: 37.5
TCO2: 27 mmol/L (ref 0–100)
TCO2: 25 mmol/L (ref 0–100)
TCO2: 26 mmol/L (ref 0–100)
TCO2: 24 mmol/L (ref 0–100)
pCO2 arterial: 37.8 mmHg (ref 35.0–45.0)
pCO2 arterial: 51.4 mmHg — ABNORMAL HIGH (ref 35.0–45.0)
pCO2 arterial: 42.7 mmHg (ref 35.0–45.0)
pH, Arterial: 7.383 (ref 7.350–7.450)
pH, Arterial: 7.39 (ref 7.350–7.450)
pH, Arterial: 7.341 — ABNORMAL LOW (ref 7.350–7.450)
pO2, Arterial: 377 mmHg — ABNORMAL HIGH (ref 80.0–100.0)
pO2, Arterial: 99 mmHg (ref 80.0–100.0)
pO2, Arterial: 82 mmHg (ref 80.0–100.0)

## 2013-05-25 LAB — POCT I-STAT, CHEM 8
BUN: 13 mg/dL (ref 6–23)
Calcium, Ion: 1.28 mmol/L — ABNORMAL HIGH (ref 1.12–1.23)
Chloride: 106 mEq/L (ref 96–112)
Creatinine, Ser: 1.1 mg/dL (ref 0.50–1.35)
GLUCOSE: 129 mg/dL — AB (ref 70–99)
HCT: 41 % (ref 39.0–52.0)
Hemoglobin: 13.9 g/dL (ref 13.0–17.0)
Potassium: 5.2 mEq/L (ref 3.7–5.3)
Sodium: 143 mEq/L (ref 137–147)
TCO2: 23 mmol/L (ref 0–100)

## 2013-05-25 LAB — CBC
HCT: 37.6 % — ABNORMAL LOW (ref 39.0–52.0)
HEMATOCRIT: 43.3 % (ref 39.0–52.0)
HEMATOCRIT: 39 % (ref 39.0–52.0)
HEMOGLOBIN: 15.7 g/dL (ref 13.0–17.0)
Hemoglobin: 13.4 g/dL (ref 13.0–17.0)
Hemoglobin: 13.8 g/dL (ref 13.0–17.0)
MCH: 30.7 pg (ref 26.0–34.0)
MCH: 30.2 pg (ref 26.0–34.0)
MCH: 30.3 pg (ref 26.0–34.0)
MCHC: 36.3 g/dL — ABNORMAL HIGH (ref 30.0–36.0)
MCHC: 35.6 g/dL (ref 30.0–36.0)
MCHC: 35.4 g/dL (ref 30.0–36.0)
MCV: 84.6 fL (ref 78.0–100.0)
MCV: 84.7 fL (ref 78.0–100.0)
MCV: 85.7 fL (ref 78.0–100.0)
PLATELETS: 145 10*3/uL — AB (ref 150–400)
PLATELETS: 178 10*3/uL (ref 150–400)
Platelets: 234 10*3/uL (ref 150–400)
RBC: 5.12 MIL/uL (ref 4.22–5.81)
RBC: 4.44 MIL/uL (ref 4.22–5.81)
RBC: 4.55 MIL/uL (ref 4.22–5.81)
RDW: 13.6 % (ref 11.5–15.5)
RDW: 13.5 % (ref 11.5–15.5)
RDW: 13.8 % (ref 11.5–15.5)
WBC: 7.8 10*3/uL (ref 4.0–10.5)
WBC: 12 10*3/uL — ABNORMAL HIGH (ref 4.0–10.5)
WBC: 14.9 10*3/uL — ABNORMAL HIGH (ref 4.0–10.5)

## 2013-05-25 LAB — POCT I-STAT GLUCOSE
GLUCOSE: 101 mg/dL — AB (ref 70–99)
OPERATOR ID: 305741

## 2013-05-25 LAB — MAGNESIUM: Magnesium: 3 mg/dL — ABNORMAL HIGH (ref 1.5–2.5)

## 2013-05-25 LAB — URINALYSIS, ROUTINE W REFLEX MICROSCOPIC
Bilirubin Urine: NEGATIVE
GLUCOSE, UA: NEGATIVE mg/dL
Hgb urine dipstick: NEGATIVE
Ketones, ur: NEGATIVE mg/dL
Leukocytes, UA: NEGATIVE
Nitrite: NEGATIVE
Protein, ur: NEGATIVE mg/dL
SPECIFIC GRAVITY, URINE: 1.018 (ref 1.005–1.030)
UROBILINOGEN UA: 1 mg/dL (ref 0.0–1.0)
pH: 5.5 (ref 5.0–8.0)

## 2013-05-25 LAB — GLUCOSE, CAPILLARY
GLUCOSE-CAPILLARY: 107 mg/dL — AB (ref 70–99)
GLUCOSE-CAPILLARY: 97 mg/dL (ref 70–99)
GLUCOSE-CAPILLARY: 108 mg/dL — AB (ref 70–99)
GLUCOSE-CAPILLARY: 115 mg/dL — AB (ref 70–99)
Glucose-Capillary: 113 mg/dL — ABNORMAL HIGH (ref 70–99)
Glucose-Capillary: 123 mg/dL — ABNORMAL HIGH (ref 70–99)

## 2013-05-25 LAB — BASIC METABOLIC PANEL
BUN: 12 mg/dL (ref 6–23)
CHLORIDE: 103 meq/L (ref 96–112)
CO2: 22 meq/L (ref 19–32)
Calcium: 9.7 mg/dL (ref 8.4–10.5)
Creatinine, Ser: 0.97 mg/dL (ref 0.50–1.35)
GFR calc Af Amer: >90 mL/min (ref >90)
GFR calc non Af Amer: >90 mL/min (ref >90)
GLUCOSE: 103 mg/dL — AB (ref 70–99)
POTASSIUM: 4.1 meq/L (ref 3.7–5.3)
SODIUM: 140 meq/L (ref 137–147)

## 2013-05-25 LAB — HEMOGLOBIN AND HEMATOCRIT, BLOOD
HEMATOCRIT: 28.7 % — AB (ref 39.0–52.0)
HEMOGLOBIN: 10.1 g/dL — AB (ref 13.0–17.0)

## 2013-05-25 LAB — SURGICAL PCR SCREEN
MRSA, PCR: NEGATIVE
STAPHYLOCOCCUS AUREUS: POSITIVE — AB

## 2013-05-25 LAB — PLATELET COUNT: PLATELETS: 176 10*3/uL (ref 150–400)

## 2013-05-25 LAB — HEPARIN LEVEL (UNFRACTIONATED): Heparin Unfractionated: 0.62 IU/mL (ref 0.30–0.70)

## 2013-05-25 LAB — PROTIME-INR
INR: 1.16 (ref 0.00–1.49)
PROTHROMBIN TIME: 14.6 s (ref 11.6–15.2)

## 2013-05-25 LAB — APTT: aPTT: 33 seconds (ref 24–37)

## 2013-05-25 SURGERY — CORONARY ARTERY BYPASS GRAFTING (CABG)
Anesthesia: General | Site: Leg Upper | Laterality: Right

## 2013-05-25 MED ORDER — ACETAMINOPHEN 650 MG RE SUPP
650.0000 mg | Freq: Once | RECTAL | Status: AC
  Administered 2013-05-25: 650 mg via RECTAL

## 2013-05-25 MED ORDER — DEXTROSE 5 % IV SOLN
1.5000 g | Freq: Two times a day (BID) | INTRAVENOUS | Status: AC
  Administered 2013-05-25 – 2013-05-27 (×4): 1.5 g via INTRAVENOUS
  Filled 2013-05-25 (×4): qty 1.5

## 2013-05-25 MED ORDER — DEXMEDETOMIDINE HCL IN NACL 200 MCG/50ML IV SOLN
0.1000 ug/kg/h | INTRAVENOUS | Status: DC
  Filled 2013-05-25: qty 50

## 2013-05-25 MED ORDER — PROPOFOL 10 MG/ML IV BOLUS
INTRAVENOUS | Status: DC | PRN
  Administered 2013-05-25: 30 mg via INTRAVENOUS
  Administered 2013-05-25: 80 mg via INTRAVENOUS
  Administered 2013-05-25: 30 mg via INTRAVENOUS

## 2013-05-25 MED ORDER — FAMOTIDINE IN NACL 20-0.9 MG/50ML-% IV SOLN
20.0000 mg | Freq: Two times a day (BID) | INTRAVENOUS | Status: AC
  Administered 2013-05-25 (×2): 20 mg via INTRAVENOUS
  Filled 2013-05-25: qty 50

## 2013-05-25 MED ORDER — FENTANYL CITRATE 0.05 MG/ML IJ SOLN
INTRAMUSCULAR | Status: AC
  Filled 2013-05-25: qty 5

## 2013-05-25 MED ORDER — NITROGLYCERIN IN D5W 200-5 MCG/ML-% IV SOLN: 0.0000 ug/min | INTRAVENOUS | Status: DC

## 2013-05-25 MED ORDER — PHENYLEPHRINE HCL 10 MG/ML IJ SOLN
0.0000 ug/min | INTRAVENOUS | Status: DC
  Filled 2013-05-25: qty 2

## 2013-05-25 MED ORDER — OXYCODONE HCL 5 MG PO TABS
5.0000 mg | ORAL_TABLET | ORAL | Status: DC | PRN
  Administered 2013-05-26 – 2013-05-27 (×2): 5 mg via ORAL
  Filled 2013-05-25 (×2): qty 1

## 2013-05-25 MED ORDER — SODIUM CHLORIDE 0.9 % IV SOLN
INTRAVENOUS | Status: DC
  Filled 2013-05-25: qty 1

## 2013-05-25 MED ORDER — VANCOMYCIN HCL IN DEXTROSE 1-5 GM/200ML-% IV SOLN
1000.0000 mg | Freq: Once | INTRAVENOUS | Status: AC
  Administered 2013-05-25: 1000 mg via INTRAVENOUS
  Filled 2013-05-25: qty 200

## 2013-05-25 MED ORDER — LACTATED RINGERS IV SOLN
INTRAVENOUS | Status: DC | PRN
  Administered 2013-05-25: 06:00:00 via INTRAVENOUS

## 2013-05-25 MED ORDER — METOPROLOL TARTRATE 1 MG/ML IV SOLN: 2.5000 mg | INTRAVENOUS | Status: DC | PRN

## 2013-05-25 MED ORDER — METOPROLOL TARTRATE 12.5 MG HALF TABLET
12.5000 mg | ORAL_TABLET | Freq: Two times a day (BID) | ORAL | Status: DC
  Administered 2013-05-25: 12.5 mg via ORAL
  Filled 2013-05-25 (×3): qty 1

## 2013-05-25 MED ORDER — HEMOSTATIC AGENTS (NO CHARGE) OPTIME
TOPICAL | Status: DC | PRN
  Administered 2013-05-25: 3 via TOPICAL

## 2013-05-25 MED ORDER — INSULIN ASPART 100 UNIT/ML ~~LOC~~ SOLN
0.0000 [IU] | SUBCUTANEOUS | Status: DC
  Administered 2013-05-25: 2 [IU] via SUBCUTANEOUS

## 2013-05-25 MED ORDER — HEPARIN SODIUM (PORCINE) 1000 UNIT/ML IJ SOLN
INTRAMUSCULAR | Status: AC
  Filled 2013-05-25: qty 1

## 2013-05-25 MED ORDER — PROPOFOL 10 MG/ML IV BOLUS
INTRAVENOUS | Status: AC
  Filled 2013-05-25: qty 20

## 2013-05-25 MED ORDER — LACTATED RINGERS IV SOLN: 500.0000 mL | Freq: Once | INTRAVENOUS | Status: AC | PRN

## 2013-05-25 MED ORDER — SODIUM CHLORIDE 0.45 % IV SOLN
INTRAVENOUS | Status: DC
  Administered 2013-05-25: 15:00:00 via INTRAVENOUS

## 2013-05-25 MED ORDER — HEMOSTATIC AGENTS (NO CHARGE) OPTIME
TOPICAL | Status: DC | PRN
  Administered 2013-05-25: 1 via TOPICAL

## 2013-05-25 MED ORDER — VECURONIUM BROMIDE 10 MG IV SOLR
INTRAVENOUS | Status: DC | PRN
  Administered 2013-05-25: 5 mg via INTRAVENOUS
  Administered 2013-05-25: 10 mg via INTRAVENOUS
  Administered 2013-05-25 (×3): 5 mg via INTRAVENOUS

## 2013-05-25 MED ORDER — SODIUM CHLORIDE 0.9 % IJ SOLN: 3.0000 mL | INTRAMUSCULAR | Status: DC | PRN

## 2013-05-25 MED ORDER — BISACODYL 10 MG RE SUPP: 10.0000 mg | Freq: Every day | RECTAL | Status: DC

## 2013-05-25 MED ORDER — THROMBIN 20000 UNITS EX SOLR
CUTANEOUS | Status: DC | PRN
  Administered 2013-05-25: 20000 [IU] via TOPICAL

## 2013-05-25 MED ORDER — ACETAMINOPHEN 160 MG/5ML PO SOLN: 650.0000 mg | Freq: Once | ORAL | Status: AC

## 2013-05-25 MED ORDER — MIDAZOLAM HCL 2 MG/2ML IJ SOLN
INTRAMUSCULAR | Status: AC
  Filled 2013-05-25: qty 2

## 2013-05-25 MED ORDER — MIDAZOLAM HCL 10 MG/2ML IJ SOLN
INTRAMUSCULAR | Status: AC
  Filled 2013-05-25: qty 2

## 2013-05-25 MED ORDER — PROTAMINE SULFATE 10 MG/ML IV SOLN
INTRAVENOUS | Status: DC | PRN
  Administered 2013-05-25: 50 mg via INTRAVENOUS
  Administered 2013-05-25: 35 mg via INTRAVENOUS
  Administered 2013-05-25: 50 mg via INTRAVENOUS
  Administered 2013-05-25: 25 mg via INTRAVENOUS
  Administered 2013-05-25 (×3): 50 mg via INTRAVENOUS

## 2013-05-25 MED ORDER — LACTATED RINGERS IV SOLN
INTRAVENOUS | Status: DC | PRN
  Administered 2013-05-25 (×2): via INTRAVENOUS

## 2013-05-25 MED ORDER — FENTANYL CITRATE 0.05 MG/ML IJ SOLN
INTRAMUSCULAR | Status: DC | PRN
  Administered 2013-05-25: 500 ug via INTRAVENOUS
  Administered 2013-05-25: 100 ug via INTRAVENOUS
  Administered 2013-05-25: 50 ug via INTRAVENOUS
  Administered 2013-05-25: 250 ug via INTRAVENOUS
  Administered 2013-05-25 (×2): 150 ug via INTRAVENOUS
  Administered 2013-05-25 (×2): 250 ug via INTRAVENOUS
  Administered 2013-05-25: 100 ug via INTRAVENOUS
  Administered 2013-05-25: 50 ug via INTRAVENOUS
  Administered 2013-05-25: 100 ug via INTRAVENOUS
  Administered 2013-05-25: 150 ug via INTRAVENOUS
  Administered 2013-05-25: 100 ug via INTRAVENOUS
  Administered 2013-05-25: 150 ug via INTRAVENOUS
  Administered 2013-05-25: 50 ug via INTRAVENOUS
  Administered 2013-05-25: 100 ug via INTRAVENOUS

## 2013-05-25 MED ORDER — MORPHINE SULFATE 2 MG/ML IJ SOLN: 1.0000 mg | INTRAMUSCULAR | Status: AC | PRN

## 2013-05-25 MED ORDER — PROTAMINE SULFATE 10 MG/ML IV SOLN
INTRAVENOUS | Status: AC
  Filled 2013-05-25: qty 5

## 2013-05-25 MED ORDER — CHLORHEXIDINE GLUCONATE CLOTH 2 % EX PADS: 6.0000 | MEDICATED_PAD | Freq: Every day | CUTANEOUS | Status: DC

## 2013-05-25 MED ORDER — VECURONIUM BROMIDE 10 MG IV SOLR
INTRAVENOUS | Status: AC
  Filled 2013-05-25: qty 20

## 2013-05-25 MED ORDER — BISACODYL 5 MG PO TBEC
10.0000 mg | DELAYED_RELEASE_TABLET | Freq: Every day | ORAL | Status: DC
Start: 2013-05-26 — End: 2013-05-27
  Administered 2013-05-27: 10 mg via ORAL
  Filled 2013-05-25 (×2): qty 2

## 2013-05-25 MED ORDER — ASPIRIN 81 MG PO CHEW: 324.0000 mg | CHEWABLE_TABLET | Freq: Every day | ORAL | Status: DC

## 2013-05-25 MED ORDER — KETOROLAC TROMETHAMINE 15 MG/ML IJ SOLN
15.0000 mg | Freq: Four times a day (QID) | INTRAMUSCULAR | Status: AC
  Administered 2013-05-26: 15 mg via INTRAVENOUS
  Filled 2013-05-25 (×2): qty 1

## 2013-05-25 MED ORDER — LACTATED RINGERS IV SOLN
INTRAVENOUS | Status: DC | PRN
  Administered 2013-05-25: 08:00:00 via INTRAVENOUS

## 2013-05-25 MED ORDER — METOPROLOL TARTRATE 25 MG/10 ML ORAL SUSPENSION
12.5000 mg | Freq: Two times a day (BID) | ORAL | Status: DC
  Filled 2013-05-25 (×3): qty 5

## 2013-05-25 MED ORDER — LACTATED RINGERS IV SOLN: INTRAVENOUS | Status: DC

## 2013-05-25 MED ORDER — INSULIN REGULAR BOLUS VIA INFUSION
0.0000 [IU] | Freq: Three times a day (TID) | INTRAVENOUS | Status: DC
  Filled 2013-05-25: qty 10

## 2013-05-25 MED ORDER — ASPIRIN EC 325 MG PO TBEC
325.0000 mg | DELAYED_RELEASE_TABLET | Freq: Every day | ORAL | Status: DC
  Administered 2013-05-27: 325 mg via ORAL
  Filled 2013-05-25 (×2): qty 1

## 2013-05-25 MED ORDER — THROMBIN 20000 UNITS EX SOLR
CUTANEOUS | Status: AC
  Filled 2013-05-25: qty 20000

## 2013-05-25 MED ORDER — PROTAMINE SULFATE 10 MG/ML IV SOLN
INTRAVENOUS | Status: AC
  Filled 2013-05-25: qty 25

## 2013-05-25 MED ORDER — MORPHINE SULFATE 2 MG/ML IJ SOLN
2.0000 mg | INTRAMUSCULAR | Status: DC | PRN
  Administered 2013-05-25 – 2013-05-26 (×4): 2 mg via INTRAVENOUS
  Administered 2013-05-27: 4 mg via INTRAVENOUS
  Filled 2013-05-25 (×4): qty 1
  Filled 2013-05-25: qty 2

## 2013-05-25 MED ORDER — ONDANSETRON HCL 4 MG/2ML IJ SOLN
4.0000 mg | Freq: Four times a day (QID) | INTRAMUSCULAR | Status: DC | PRN
  Administered 2013-05-26 – 2013-05-27 (×2): 4 mg via INTRAVENOUS
  Filled 2013-05-25 (×3): qty 2

## 2013-05-25 MED ORDER — ALBUMIN HUMAN 5 % IV SOLN
250.0000 mL | INTRAVENOUS | Status: AC | PRN
  Administered 2013-05-25 (×2): 250 mL via INTRAVENOUS

## 2013-05-25 MED ORDER — ACETAMINOPHEN 500 MG PO TABS
1000.0000 mg | ORAL_TABLET | Freq: Four times a day (QID) | ORAL | Status: DC
  Administered 2013-05-26 – 2013-05-27 (×5): 1000 mg via ORAL
  Filled 2013-05-25 (×13): qty 2

## 2013-05-25 MED ORDER — HEPARIN SODIUM (PORCINE) 1000 UNIT/ML IJ SOLN
INTRAMUSCULAR | Status: DC | PRN
  Administered 2013-05-25: 5000 [IU] via INTRAVENOUS
  Administered 2013-05-25: 10000 [IU] via INTRAVENOUS
  Administered 2013-05-25: 5000 [IU] via INTRAVENOUS
  Administered 2013-05-25 (×2): 10000 [IU] via INTRAVENOUS

## 2013-05-25 MED ORDER — DOCUSATE SODIUM 100 MG PO CAPS
200.0000 mg | ORAL_CAPSULE | Freq: Every day | ORAL | Status: DC
  Administered 2013-05-27: 200 mg via ORAL
  Filled 2013-05-25 (×2): qty 2

## 2013-05-25 MED ORDER — MIDAZOLAM HCL 5 MG/5ML IJ SOLN
INTRAMUSCULAR | Status: DC | PRN
  Administered 2013-05-25: 4 mg via INTRAVENOUS
  Administered 2013-05-25: 2 mg via INTRAVENOUS
  Administered 2013-05-25: 4 mg via INTRAVENOUS
  Administered 2013-05-25 (×4): 2 mg via INTRAVENOUS
  Administered 2013-05-25: 6 mg via INTRAVENOUS

## 2013-05-25 MED ORDER — MUPIROCIN 2 % EX OINT
1.0000 | TOPICAL_OINTMENT | Freq: Two times a day (BID) | CUTANEOUS | Status: DC
Start: 2013-05-25 — End: 2013-05-25
  Administered 2013-05-25: 1 via NASAL
  Filled 2013-05-25: qty 22

## 2013-05-25 MED ORDER — ACETAMINOPHEN 160 MG/5ML PO SOLN
1000.0000 mg | Freq: Four times a day (QID) | ORAL | Status: DC
  Filled 2013-05-25: qty 40

## 2013-05-25 MED ORDER — MIDAZOLAM HCL 2 MG/2ML IJ SOLN: 2.0000 mg | INTRAMUSCULAR | Status: DC | PRN

## 2013-05-25 MED ORDER — MAGNESIUM SULFATE 4000MG/100ML IJ SOLN
4.0000 g | Freq: Once | INTRAMUSCULAR | Status: AC
  Administered 2013-05-25: 4 g via INTRAVENOUS
  Filled 2013-05-25: qty 100

## 2013-05-25 MED ORDER — 0.9 % SODIUM CHLORIDE (POUR BTL) OPTIME
TOPICAL | Status: DC | PRN
  Administered 2013-05-25: 1000 mL

## 2013-05-25 MED ORDER — KETOROLAC TROMETHAMINE 15 MG/ML IJ SOLN
INTRAMUSCULAR | Status: AC
  Administered 2013-05-25: 15 mg
  Filled 2013-05-25: qty 1

## 2013-05-25 MED ORDER — SODIUM CHLORIDE 0.9 % IV SOLN: INTRAVENOUS | Status: DC

## 2013-05-25 MED ORDER — POTASSIUM CHLORIDE 10 MEQ/50ML IV SOLN: 10.0000 meq | INTRAVENOUS | Status: AC

## 2013-05-25 MED ORDER — PANTOPRAZOLE SODIUM 40 MG PO TBEC
40.0000 mg | DELAYED_RELEASE_TABLET | Freq: Every day | ORAL | Status: DC
  Administered 2013-05-27: 40 mg via ORAL
  Filled 2013-05-25: qty 1

## 2013-05-25 MED ORDER — SODIUM CHLORIDE 0.9 % IV SOLN
250.0000 mL | INTRAVENOUS | Status: DC
Start: 2013-05-26 — End: 2013-05-27

## 2013-05-25 MED ORDER — SODIUM CHLORIDE 0.9 % IJ SOLN
3.0000 mL | Freq: Two times a day (BID) | INTRAMUSCULAR | Status: DC
  Administered 2013-05-26 – 2013-05-27 (×3): 3 mL via INTRAVENOUS

## 2013-05-25 MED ORDER — METOCLOPRAMIDE HCL 5 MG/ML IJ SOLN
10.0000 mg | Freq: Four times a day (QID) | INTRAMUSCULAR | Status: AC
  Administered 2013-05-25 – 2013-05-26 (×4): 10 mg via INTRAVENOUS
  Filled 2013-05-25 (×3): qty 2

## 2013-05-25 MED ORDER — VECURONIUM BROMIDE 10 MG IV SOLR
INTRAVENOUS | Status: AC
  Filled 2013-05-25: qty 10

## 2013-05-25 MED ORDER — STERILE WATER FOR INJECTION IJ SOLN
INTRAMUSCULAR | Status: AC
  Filled 2013-05-25: qty 10

## 2013-05-25 MED ORDER — ROCURONIUM BROMIDE 100 MG/10ML IV SOLN
INTRAVENOUS | Status: DC | PRN
  Administered 2013-05-25: 50 mg via INTRAVENOUS

## 2013-05-25 SURGICAL SUPPLY — 115 items
ADH SKN CLS APL DERMABOND .7 (GAUZE/BANDAGES/DRESSINGS) ×4
ATTRACTOMAT 16X20 MAGNETIC DRP (DRAPES) ×6 IMPLANT
BAG DECANTER FOR FLEXI CONT (MISCELLANEOUS) ×12 IMPLANT
BANDAGE ELASTIC 4 VELCRO ST LF (GAUZE/BANDAGES/DRESSINGS) ×6 IMPLANT
BANDAGE ELASTIC 6 VELCRO ST LF (GAUZE/BANDAGES/DRESSINGS) ×6 IMPLANT
BANDAGE GAUZE ELAST BULKY 4 IN (GAUZE/BANDAGES/DRESSINGS) ×6 IMPLANT
BASKET HEART  (ORDER IN 25'S) (MISCELLANEOUS) ×1
BASKET HEART (ORDER IN 25'S) (MISCELLANEOUS) ×1
BASKET HEART (ORDER IN 25S) (MISCELLANEOUS) ×4 IMPLANT
BLADE STERNUM SYSTEM 6 (BLADE) ×6 IMPLANT
BLADE SURG 15 STRL LF DISP TIS (BLADE) IMPLANT
BLADE SURG 15 STRL SS (BLADE)
CANISTER SUCTION 2500CC (MISCELLANEOUS) ×6 IMPLANT
CANNULA ARTERIAL NVNT 3/8 20FR (MISCELLANEOUS) ×6 IMPLANT
CARDIAC SUCTION (MISCELLANEOUS) ×6 IMPLANT
CATH ROBINSON RED A/P 18FR (CATHETERS) ×12 IMPLANT
CATH THORACIC 28FR (CATHETERS) ×24 IMPLANT
CATH THORACIC 36FR (CATHETERS) ×12 IMPLANT
CATH THORACIC 36FR RT ANG (CATHETERS) ×6 IMPLANT
CLIP TI MEDIUM 24 (CLIP) IMPLANT
CLIP TI WIDE RED SMALL 24 (CLIP) ×18 IMPLANT
CONN ST 1/4X3/8  BEN (MISCELLANEOUS) ×2
CONN ST 1/4X3/8 BEN (MISCELLANEOUS) ×4 IMPLANT
COVER MAYO STAND STRL (DRAPES) ×6 IMPLANT
COVER SURGICAL LIGHT HANDLE (MISCELLANEOUS) ×6 IMPLANT
CRADLE DONUT ADULT HEAD (MISCELLANEOUS) ×6 IMPLANT
DERMABOND ADVANCED (GAUZE/BANDAGES/DRESSINGS) ×2
DERMABOND ADVANCED .7 DNX12 (GAUZE/BANDAGES/DRESSINGS) ×4 IMPLANT
DRAPE CARDIOVASCULAR INCISE (DRAPES) ×6
DRAPE EXTREMITY T 121X128X90 (DRAPE) ×6 IMPLANT
DRAPE PROXIMA HALF (DRAPES) ×6 IMPLANT
DRAPE SLUSH/WARMER DISC (DRAPES) ×6 IMPLANT
DRAPE SRG 135X102X78XABS (DRAPES) ×4 IMPLANT
DRSG COVADERM 4X14 (GAUZE/BANDAGES/DRESSINGS) ×6 IMPLANT
ELECT CAUTERY BLADE 6.4 (BLADE) ×6 IMPLANT
ELECT REM PT RETURN 9FT ADLT (ELECTROSURGICAL) ×12
ELECTRODE REM PT RTRN 9FT ADLT (ELECTROSURGICAL) ×8 IMPLANT
GEL ULTRASOUND 20GR AQUASONIC (MISCELLANEOUS) IMPLANT
GLOVE BIO SURGEON STRL SZ 6 (GLOVE) ×12 IMPLANT
GLOVE BIO SURGEON STRL SZ 6.5 (GLOVE) ×15 IMPLANT
GLOVE BIO SURGEON STRL SZ7 (GLOVE) IMPLANT
GLOVE BIO SURGEON STRL SZ7.5 (GLOVE) IMPLANT
GLOVE BIO SURGEONS STRL SZ 6.5 (GLOVE) ×3
GLOVE BIOGEL PI IND STRL 6 (GLOVE) IMPLANT
GLOVE BIOGEL PI IND STRL 6.5 (GLOVE) ×12 IMPLANT
GLOVE BIOGEL PI IND STRL 7.0 (GLOVE) ×16 IMPLANT
GLOVE BIOGEL PI INDICATOR 6 (GLOVE)
GLOVE BIOGEL PI INDICATOR 6.5 (GLOVE) ×6
GLOVE BIOGEL PI INDICATOR 7.0 (GLOVE) ×8
GLOVE EUDERMIC 7 POWDERFREE (GLOVE) ×12 IMPLANT
GLOVE ORTHO TXT STRL SZ7.5 (GLOVE) IMPLANT
GOWN STRL REUS W/ TWL LRG LVL3 (GOWN DISPOSABLE) ×28 IMPLANT
GOWN STRL REUS W/ TWL XL LVL3 (GOWN DISPOSABLE) ×4 IMPLANT
GOWN STRL REUS W/TWL LRG LVL3 (GOWN DISPOSABLE) ×42
GOWN STRL REUS W/TWL XL LVL3 (GOWN DISPOSABLE) ×6
HARMONIC SHEARS 14CM COAG (MISCELLANEOUS) IMPLANT
HEMOSTAT POWDER SURGIFOAM 1G (HEMOSTASIS) ×18 IMPLANT
HEMOSTAT SURGICEL 2X14 (HEMOSTASIS) ×6 IMPLANT
INSERT FOGARTY 61MM (MISCELLANEOUS) IMPLANT
INSERT FOGARTY XLG (MISCELLANEOUS) IMPLANT
KIT BASIN OR (CUSTOM PROCEDURE TRAY) ×6 IMPLANT
KIT CATH CPB BARTLE (MISCELLANEOUS) ×6 IMPLANT
KIT ROOM TURNOVER OR (KITS) ×6 IMPLANT
KIT SUCTION CATH 14FR (SUCTIONS) ×6 IMPLANT
KIT VASOVIEW W/TROCAR VH 2000 (KITS) ×6 IMPLANT
NS IRRIG 1000ML POUR BTL (IV SOLUTION) ×36 IMPLANT
PACK OPEN HEART (CUSTOM PROCEDURE TRAY) ×6 IMPLANT
PAD ARMBOARD 7.5X6 YLW CONV (MISCELLANEOUS) ×12 IMPLANT
PAD ELECT DEFIB RADIOL ZOLL (MISCELLANEOUS) ×6 IMPLANT
PENCIL BUTTON HOLSTER BLD 10FT (ELECTRODE) ×6 IMPLANT
PUNCH AORTIC ROTATE 4.0MM (MISCELLANEOUS) IMPLANT
PUNCH AORTIC ROTATE 4.5MM 8IN (MISCELLANEOUS) ×6 IMPLANT
PUNCH AORTIC ROTATE 5MM 8IN (MISCELLANEOUS) IMPLANT
SET CARDIOPLEGIA MPS 5001102 (MISCELLANEOUS) ×6 IMPLANT
SOLUTION ANTI FOG 6CC (MISCELLANEOUS) ×6 IMPLANT
SPONGE GAUZE 4X4 12PLY (GAUZE/BANDAGES/DRESSINGS) ×12 IMPLANT
SPONGE GAUZE 4X4 12PLY STER LF (GAUZE/BANDAGES/DRESSINGS) ×18 IMPLANT
SPONGE INTESTINAL PEANUT (DISPOSABLE) IMPLANT
SPONGE LAP 18X18 X RAY DECT (DISPOSABLE) ×12 IMPLANT
SPONGE LAP 4X18 X RAY DECT (DISPOSABLE) IMPLANT
SUT BONE WAX W31G (SUTURE) ×6 IMPLANT
SUT MNCRL AB 4-0 PS2 18 (SUTURE) ×6 IMPLANT
SUT PROLENE 3 0 SH DA (SUTURE) IMPLANT
SUT PROLENE 3 0 SH1 36 (SUTURE) ×6 IMPLANT
SUT PROLENE 4 0 RB 1 (SUTURE) ×6
SUT PROLENE 4 0 SH DA (SUTURE) IMPLANT
SUT PROLENE 4-0 RB1 .5 CRCL 36 (SUTURE) ×4 IMPLANT
SUT PROLENE 5 0 C 1 36 (SUTURE) IMPLANT
SUT PROLENE 6 0 C 1 30 (SUTURE) ×6 IMPLANT
SUT PROLENE 7 0 BV 1 (SUTURE) IMPLANT
SUT PROLENE 7 0 BV1 MDA (SUTURE) ×6 IMPLANT
SUT PROLENE 8 0 BV175 6 (SUTURE) ×12 IMPLANT
SUT SILK  1 MH (SUTURE) ×2
SUT SILK 1 MH (SUTURE) ×4 IMPLANT
SUT STEEL STERNAL CCS#1 18IN (SUTURE) IMPLANT
SUT STEEL SZ 6 DBL 3X14 BALL (SUTURE) ×18 IMPLANT
SUT VIC AB 1 CTX 36 (SUTURE) ×12
SUT VIC AB 1 CTX36XBRD ANBCTR (SUTURE) ×8 IMPLANT
SUT VIC AB 2-0 CT1 27 (SUTURE) ×6
SUT VIC AB 2-0 CT1 TAPERPNT 27 (SUTURE) ×4 IMPLANT
SUT VIC AB 2-0 CTX 27 (SUTURE) IMPLANT
SUT VIC AB 3-0 SH 27 (SUTURE)
SUT VIC AB 3-0 SH 27X BRD (SUTURE) IMPLANT
SUT VIC AB 3-0 X1 27 (SUTURE) IMPLANT
SUT VICRYL 4-0 PS2 18IN ABS (SUTURE) IMPLANT
SUTURE E-PAK OPEN HEART (SUTURE) ×6 IMPLANT
SYR 50ML SLIP (SYRINGE) IMPLANT
SYSTEM SAHARA CHEST DRAIN ATS (WOUND CARE) ×12 IMPLANT
TAPE CLOTH SURG 4X10 WHT LF (GAUZE/BANDAGES/DRESSINGS) ×12 IMPLANT
TOWEL OR 17X24 6PK STRL BLUE (TOWEL DISPOSABLE) ×6 IMPLANT
TOWEL OR 17X26 10 PK STRL BLUE (TOWEL DISPOSABLE) ×6 IMPLANT
TRAY FOLEY IC TEMP SENS 14FR (CATHETERS) ×6 IMPLANT
TUBING INSUFFLATION 10FT LAP (TUBING) ×6 IMPLANT
UNDERPAD 30X30 INCONTINENT (UNDERPADS AND DIAPERS) ×6 IMPLANT
WATER STERILE IRR 1000ML POUR (IV SOLUTION) ×12 IMPLANT

## 2013-05-25 NOTE — OR Nursing (Signed)
Dr Laneta Simmers in at [phone]

## 2013-05-25 NOTE — Anesthesia Postprocedure Evaluation (Signed)
  Anesthesia Post-op Note  Patient: Ricardo [name]  Procedure(s) Performed: Procedure(s): CORONARY ARTERY BYPASS GRAFTING (CABG) (N/A) INTRAOPERATIVE TRANSESOPHAGEAL ECHOCARDIOGRAM (N/A) ENDOVEIN HARVEST OF GREATER SAPHENOUS VEIN (Right)  Patient Location: SICU  Anesthesia Type:General  Level of Consciousness: sedated and Patient remains intubated per anesthesia plan  Airway and Oxygen Therapy: Patient remains intubated per anesthesia plan and Patient placed on Ventilator (see vital sign flow sheet for setting)  Post-op Pain: none  Post-op Assessment: Post-op Vital signs reviewed, Patient's Cardiovascular Status Stable and Respiratory Function Stable  Post-op Vital Signs: Reviewed and stable  Complications: No apparent anesthesia complications

## 2013-05-25 NOTE — Brief Op Note (Signed)
05/21/2013 - 05/25/2013  12:11 PM  PATIENT:  Ricardo [name]  33 y.o. male  PRE-OPERATIVE DIAGNOSIS:  CAD  POST-OPERATIVE DIAGNOSIS:  CAD  PROCEDURE:  Procedure(s):  CORONARY ARTERY BYPASS GRAFTING  X 4 -LIMA to LAD -SEQUENTIAL SVG to RAMUS INTERMEDIATE AND OM 1 -SVG TO PDA  ENDOVEIN HARVEST OF GREATER SAPHENOUS RIGHT LEG  INTRAOPERATIVE TRANSESOPHAGEAL ECHOCARDIOGRAM (N/A)  SURGEON:  Surgeon(s) and Role:    * Alleen Borne, MD - Primary  PHYSICIAN ASSISTANT: Chick Cousins PA-C  ANESTHESIA:   general  EBL:  Total I/O In: 1400 [I.V.:1400] Out: 875 [Urine:875]  BLOOD ADMINISTERED: CELLSAVER  DRAINS: Left Pleural Chest Tube, Mediastinal chest drain   LOCAL MEDICATIONS USED:  NONE  SPECIMEN:  No Specimen  DISPOSITION OF SPECIMEN:  N/A  COUNTS:  YES  TOURNIQUET:  * No tourniquets in log *  DICTATION: .Dragon Dictation  PLAN OF CARE: Admit to inpatient   PATIENT DISPOSITION:  ICU - intubated and hemodynamically stable.   Delay start of Pharmacological VTE agent (>24hrs) due to surgical blood loss or risk of bleeding: yes

## 2013-05-25 NOTE — Addendum Note (Signed)
Addendum created 05/25/13 1605 by Rosalio Macadamia, CRNA   Modules edited: Anesthesia Medication Administration

## 2013-05-25 NOTE — Progress Notes (Signed)
CT surgery p.m. Rounds  Status post CABG with bilateral IMA Patient extubated breathing comfortably good O2 sats Hemodynamic stable cardiac index 2.1 Minimal chest tube output Doing well

## 2013-05-25 NOTE — Progress Notes (Addendum)
CSW received consult for "difficulty obtaining medications." CSW passed this on to the Case Manager who will help assist with medications. CSW contacted Chaplain Services to assist patient with advanced directives. CSW signing off at this time. Please re consult if social work needs arise.  Maree Krabbe, MSW, Theresia Majors [phone]

## 2013-05-25 NOTE — OR Nursing (Signed)
Second call 2300 at 1325

## 2013-05-25 NOTE — Transfer of Care (Signed)
Immediate Anesthesia Transfer of Care Note  Patient: Ricardo [name]  Procedure(s) Performed: Procedure(s): CORONARY ARTERY BYPASS GRAFTING (CABG) (N/A) INTRAOPERATIVE TRANSESOPHAGEAL ECHOCARDIOGRAM (N/A) ENDOVEIN HARVEST OF GREATER SAPHENOUS VEIN (Right)  Patient Location: SICU  Anesthesia Type:General  Level of Consciousness: sedated and Patient remains intubated per anesthesia plan  Airway & Oxygen Therapy: Pt to remain intubated, 10L o2 per ambu bag  Post-op Assessment: Report given to PACU RN and Post -op Vital signs reviewed and stable  Post vital signs: Reviewed and stable  Complications: No apparent anesthesia complications

## 2013-05-25 NOTE — OR Nursing (Signed)
First call to 2300 at 1245

## 2013-05-25 NOTE — Procedures (Signed)
Extubation Procedure Note  Patient Details:   Name: Ricardo [name] DOB: 06/10/1980 MRN: [id number]   Airway Documentation:     Evaluation  O2 sats: stable throughout Complications: No apparent complications Patient did tolerate procedure well. Bilateral Breath Sounds: Clear   Yes pt able to vocalize.   Pt extubated at this time per Rapid wean protocol. Pt placed on 4L [place]. No complications. Pt able to breathe around deflated cuff. NIF -20 and VC of 0.5 L. VS stable. IS performed 500-735mLx8. RT will continue to monitor.   Loyal Jacobson [facility] 05/25/2013, 6:07 PM

## 2013-05-25 NOTE — Preoperative (Signed)
Beta Blockers   Reason not to administer Beta Blockers:Not Applicable, given 3/2 at 1702

## 2013-05-25 NOTE — Progress Notes (Signed)
Dr Laneta Simmers paged with pre extubation ABG, nif -20, vital capacity .  Orders to extubate.  Will check 1 hour post extubation ABG and monitor.

## 2013-05-25 NOTE — Anesthesia Preprocedure Evaluation (Addendum)
Anesthesia Evaluation  Patient identified by MRN, date of birth, ID band Patient awake    Reviewed: Allergy & Precautions, H&P , NPO status , Patient's Chart, lab work & pertinent test results  Airway Mallampati: II TM Distance: >3 FB Neck ROM: Full    Dental  (+) Dental Advisory Given   Pulmonary Current Smoker,    Pulmonary exam normal       Cardiovascular hypertension, Pt. on medications + Past MI  Echo: Mild LVH with LVEF 35-40%, wall motion abnormalties   consistent with ischemic cardiomyopathy, grade 1 diastolic   dysfunction. Mild left atrial enlargement. Trivial mitral   and tricuspid regurgitation. Unable to assess PASP. Cardiac cath shows severe 3-vessel coronary disease with 100% occlusion of the RCA and mid LCX with left to right collaterals filling the distal RCA and bridging collaterals filling the distal LCX OM. There is a large Ramus with a 90% stenosis. The LAD has 50-70% proximal stenosis and the first diagonal has 90% ostial stenosis. The LVEF is 30-35% with global hypokinesis and severe hypokinesis of the anterior wall.    Neuro/Psych  Headaches, negative psych ROS   GI/Hepatic negative GI ROS, Neg liver ROS,   Endo/Other  Morbid obesity  Renal/GU Renal disease     Musculoskeletal   Abdominal   Peds  Hematology   Anesthesia Other Findings   Reproductive/Obstetrics                        Anesthesia Physical Anesthesia Plan  ASA: IV  Anesthesia Plan: General   Post-op Pain Management:    Induction: Intravenous  Airway Management Planned: Oral ETT  Additional Equipment: Arterial line, 3D TEE and PA Cath  Intra-op Plan:   Post-operative Plan: Post-operative intubation/ventilation  Informed Consent: I have reviewed the patients History and Physical, chart, labs and discussed the procedure including the risks, benefits and alternatives for the proposed anesthesia with the  patient or authorized representative who has indicated his/her understanding and acceptance.   Dental advisory given  Plan Discussed with: CRNA, Anesthesiologist and Surgeon  Anesthesia Plan Comments:       Anesthesia Quick Evaluation

## 2013-05-25 NOTE — Op Note (Signed)
CARDIOVASCULAR SURGERY OPERATIVE NOTE  05/25/2013  Surgeon:  Alleen Borne, MD  First Assistant: Lowella Dandy,  PA-C   Preoperative Diagnosis:  Severe multi-vessel coronary artery disease   Postoperative Diagnosis:  Same   Procedure:  1. Median Sternotomy 2. Extracorporeal circulation 3.   Coronary artery bypass grafting x 4   Left internal mammary graft to the LAD  Sequential SVG to Ramus and OM  SVG to PDA  4.   Endoscopic vein harvest from the right leg   Anesthesia:  General Endotracheal   Clinical History/Surgical Indication:  33 year old gentleman with a history of HTN, strong family history of premature coronary disease, and smoking who reports a many year history of heart burn type pain and belching that he thought was due to reflux. On Monday and Tuesday of this week he developed substernal chest pain with radiation to the right arm and hand that he has never had before. It worsened and he came to the ER where troponin was positive at 11.74. Cardiac cath shows severe 3-vessel coronary disease with 100% occlusion of the RCA and mid LCX with left to right collaterals filling the distal RCA and bridging collaterals filling the distal LCX OM. There is a large Ramus with a 90% stenosis. The LAD has 50-70% proximal stenosis and the first diagonal has 90% ostial stenosis. The LVEF is 30-35% with global hypokinesis and severe hypokinesis of the anterior wall. He has severe multi-vessel coronary disease with moderate LV dysfunction s/p acute NSTEMI. I agree that CABG is the best treatment for this patient to improve his symptoms and prevent further ischemia and infarction. I discussed the operative procedure with the patient and family including alternatives, benefits and risks; including but not limited to bleeding, blood transfusion, infection, stroke, myocardial infarction, graft  failure, heart block requiring a permanent pacemaker, organ dysfunction, and death. Ricardo [name] understands and agrees to proceed.    Preparation:  The patient was seen in the preoperative holding area and the correct patient, correct operation were confirmed with the patient after reviewing the medical record and catheterization. The consent was signed by me. Preoperative antibiotics were given. A pulmonary arterial line and radial arterial line were placed by the anesthesia team. The patient was taken back to the operating room and positioned supine on the operating room table. After being placed under general endotracheal anesthesia by the anesthesia team a foley catheter was placed. The neck, chest, abdomen, and both legs were prepped with betadine soap and solution and draped in the usual sterile manner. A surgical time-out was taken and the correct patient and operative procedure were confirmed with the nursing and anesthesia staff.   Cardiopulmonary Bypass:  A median sternotomy was performed. The pericardium was opened in the midline. Right ventricular function appeared normal. The ascending aorta was of normal size and had no palpable plaque. There were no contraindications to aortic cannulation or cross-clamping. The patient was fully systemically heparinized and the ACT was maintained > 400 sec. The proximal aortic arch was cannulated with a 20 F aortic cannula for arterial inflow. Venous cannulation was performed via the right atrial appendage using a two-staged venous cannula. An antegrade cardioplegia/vent cannula was inserted into the mid-ascending aorta. Aortic occlusion was performed with a single cross-clamp. Systemic cooling to 32 degrees Centigrade and topical cooling of the heart with iced saline were used. Hyperkalemic antegrade cold blood cardioplegia was used to induce diastolic arrest and was then given at about 20 minute intervals throughout  the period of arrest to maintain  myocardial temperature at or below 10 degrees centigrade. A temperature probe was inserted into the interventricular septum and an insulating pad was placed in the pericardium.   Left internal mammary harvest:  The left side of the sternum was retracted using the Rultract retractor. The left internal mammary artery was harvested as a pedicle graft. All side branches were clipped. It was a small-medium-sized vessel of good quality with good blood flow. It was ligated distally and divided. It was sprayed with topical papaverine solution to prevent vasospasm.  I then examined the right internal mammary artery and it was smaller but I felt that it looked acceptable. The vessel was harvested as a pedicle graft. Unfortunately it had an early bifurcation into two tiny branches and the length of vessel proximal to this was not enough to reach the PDA or even the distal RCA which was diseased. I considered using it for the diagonal vessel as a free graft but the diagonal was so severely diseased that there was no soft area to open and it was relatively small. The right IMA was not large enough to use for the large Ramus. Therefore I decided not to use it. I did not use a radial artery graft in this patient because the left radial pulse was weak and I though it would probably be a small vessel.   Endoscopic vein harvest:  The right greater saphenous vein was harvested endoscopically through a 2 cm incision medial to the right knee. It was harvested from the upper thigh to below the knee. It was a medium-sized vein of good quality. The side branches were all ligated with 4-0 silk ties.    Coronary arteries:  The coronary arteries were examined.   LAD:  Diffusely diseased throughout the proximal and mid-vessel. Mild distal disease  LCX:  Large Ramus with mild mid-vessel disease then became intramyocardial distally. The distal OM was a medium-sized vessel with no distal disease.  RCA:  Diffusely diseased  with disease entending out to the takeoff of the PDA. The PDA was large with no disease. There were 2 small PL branches.   Grafts:  1. LIMA to the LAD: 2.0 mm. It was sewn end to side using 8-0 prolene continuous suture. 2. Sequential SVG to Ramus:  2.5 mm. It was sewn side to side using 7-0 prolene continuous suture. 3. Sequential SVG to OM:  1.6 mm. It was sewn end to side using 7-0 prolene continuous suture. 4. SVG to PDA:  1.75 mm. It was sewn end to side using 7-0 prolene continuous suture.  The proximal vein graft anastomoses were performed to the mid-ascending aorta using continuous 6-0 prolene suture. Graft markers were placed around the proximal anastomoses.2   Completion:  The patient was rewarmed to 37 degrees Centigrade. The clamp was removed from the LIMA pedicle and there was rapid warming of the septum and return of ventricular fibrillation. The crossclamp was removed with a time of 84 minutes. There was spontaneous return of sinus rhythm. The distal and proximal anastomoses were checked for hemostasis. The position of the grafts was satisfactory. Two temporary epicardial pacing wires were placed on the right atrium and two on the right ventricle. The patient was weaned from CPB without difficulty on no inotropes. CPB time was 104 minutes. Cardiac output was 6 LPM. Heparin was fully reversed with protamine and the aortic and venous cannulas removed. Hemostasis was achieved. Mediastinal and left pleural drainage tubes were placed. The  sternum was closed with double #6 stainless steel wires. The fascia was closed with continuous # 1 vicryl suture. The subcutaneous tissue was closed with 2-0 vicryl continuous suture. The skin was closed with 3-0 vicryl subcuticular suture. All sponge, needle, and instrument counts were reported correct at the end of the case. Dry sterile dressings were placed over the incisions and around the chest tubes which were connected to pleurevac suction. The  patient was then transported to the surgical intensive care unit in critical but stable condition.

## 2013-05-26 ENCOUNTER — Encounter (HOSPITAL_COMMUNITY): Payer: Self-pay | Admitting: Surgery

## 2013-05-26 ENCOUNTER — Inpatient Hospital Stay (HOSPITAL_COMMUNITY): Payer: Self-pay

## 2013-05-26 LAB — GLUCOSE, CAPILLARY
GLUCOSE-CAPILLARY: 100 mg/dL — AB (ref 70–99)
GLUCOSE-CAPILLARY: 114 mg/dL — AB (ref 70–99)
GLUCOSE-CAPILLARY: 114 mg/dL — AB (ref 70–99)
GLUCOSE-CAPILLARY: 43 mg/dL — AB (ref 70–99)
GLUCOSE-CAPILLARY: 44 mg/dL — AB (ref 70–99)
GLUCOSE-CAPILLARY: 56 mg/dL — AB (ref 70–99)
Glucose-Capillary: 94 mg/dL (ref 70–99)

## 2013-05-26 LAB — CBC
HCT: 38.3 % — ABNORMAL LOW (ref 39.0–52.0)
HEMATOCRIT: 37.1 % — AB (ref 39.0–52.0)
HEMOGLOBIN: 13.1 g/dL (ref 13.0–17.0)
Hemoglobin: 13.5 g/dL (ref 13.0–17.0)
MCH: 30.5 pg (ref 26.0–34.0)
MCH: 30.4 pg (ref 26.0–34.0)
MCHC: 35.3 g/dL (ref 30.0–36.0)
MCHC: 35.2 g/dL (ref 30.0–36.0)
MCV: 86.5 fL (ref 78.0–100.0)
MCV: 86.3 fL (ref 78.0–100.0)
Platelets: 164 10*3/uL (ref 150–400)
Platelets: 167 10*3/uL (ref 150–400)
RBC: 4.29 MIL/uL (ref 4.22–5.81)
RBC: 4.44 MIL/uL (ref 4.22–5.81)
RDW: 14.1 % (ref 11.5–15.5)
RDW: 13.9 % (ref 11.5–15.5)
WBC: 14.6 10*3/uL — ABNORMAL HIGH (ref 4.0–10.5)
WBC: 13.8 10*3/uL — ABNORMAL HIGH (ref 4.0–10.5)

## 2013-05-26 LAB — BASIC METABOLIC PANEL
BUN: 13 mg/dL (ref 6–23)
CHLORIDE: 107 meq/L (ref 96–112)
CO2: 23 meq/L (ref 19–32)
Calcium: 8.8 mg/dL (ref 8.4–10.5)
Creatinine, Ser: 0.96 mg/dL (ref 0.50–1.35)
GFR calc Af Amer: >90 mL/min (ref >90)
GFR calc non Af Amer: >90 mL/min (ref >90)
Glucose, Bld: 101 mg/dL — ABNORMAL HIGH (ref 70–99)
Potassium: 4.6 mEq/L (ref 3.7–5.3)
Sodium: 143 mEq/L (ref 137–147)

## 2013-05-26 LAB — MAGNESIUM
MAGNESIUM: 2.2 mg/dL (ref 1.5–2.5)
Magnesium: 2.5 mg/dL (ref 1.5–2.5)

## 2013-05-26 LAB — CREATININE, SERUM
Creatinine, Ser: 1.02 mg/dL (ref 0.50–1.35)
GFR calc Af Amer: >90 mL/min (ref >90)

## 2013-05-26 MED ORDER — METOPROLOL TARTRATE 25 MG/10 ML ORAL SUSPENSION
25.0000 mg | Freq: Two times a day (BID) | ORAL | Status: DC
Start: 2013-05-26 — End: 2013-05-27
  Filled 2013-05-26 (×4): qty 10

## 2013-05-26 MED ORDER — KETOROLAC TROMETHAMINE 15 MG/ML IJ SOLN
15.0000 mg | Freq: Four times a day (QID) | INTRAMUSCULAR | Status: DC
  Administered 2013-05-26 – 2013-05-29 (×8): 15 mg via INTRAVENOUS
  Filled 2013-05-26 (×16): qty 1

## 2013-05-26 MED ORDER — DEXTROSE 50 % IV SOLN
25.0000 mL | Freq: Once | INTRAVENOUS | Status: AC | PRN
  Administered 2013-05-26: 25 mL via INTRAVENOUS

## 2013-05-26 MED ORDER — METOPROLOL TARTRATE 25 MG PO TABS
25.0000 mg | ORAL_TABLET | Freq: Two times a day (BID) | ORAL | Status: DC
  Administered 2013-05-26 – 2013-05-27 (×2): 25 mg via ORAL
  Filled 2013-05-26 (×4): qty 1

## 2013-05-26 MED ORDER — DEXTROSE 50 % IV SOLN
INTRAVENOUS | Status: AC
  Filled 2013-05-26: qty 50

## 2013-05-26 MED ORDER — FUROSEMIDE 10 MG/ML IJ SOLN
40.0000 mg | Freq: Once | INTRAMUSCULAR | Status: AC
  Administered 2013-05-26: 40 mg via INTRAVENOUS
  Filled 2013-05-26: qty 4

## 2013-05-26 MED ORDER — ENOXAPARIN SODIUM 40 MG/0.4ML ~~LOC~~ SOLN
40.0000 mg | Freq: Every day | SUBCUTANEOUS | Status: DC
  Administered 2013-05-26 – 2013-05-28 (×3): 40 mg via SUBCUTANEOUS
  Filled 2013-05-26 (×4): qty 0.4

## 2013-05-26 MED FILL — Sodium Bicarbonate IV Soln 8.4%: INTRAVENOUS | Qty: 50 | Status: AC

## 2013-05-26 MED FILL — Electrolyte-R (PH 7.4) Solution: INTRAVENOUS | Qty: 4000 | Status: AC

## 2013-05-26 MED FILL — Potassium Chloride Inj 2 mEq/ML: INTRAVENOUS | Qty: 40 | Status: AC

## 2013-05-26 MED FILL — Mannitol IV Soln 20%: INTRAVENOUS | Qty: 500 | Status: AC

## 2013-05-26 MED FILL — Heparin Sodium (Porcine) Inj 1000 Unit/ML: INTRAMUSCULAR | Qty: 30 | Status: AC

## 2013-05-26 MED FILL — Magnesium Sulfate Inj 50%: INTRAMUSCULAR | Qty: 10 | Status: AC

## 2013-05-26 MED FILL — Sodium Chloride IV Soln 0.9%: INTRAVENOUS | Qty: 2000 | Status: AC

## 2013-05-26 MED FILL — Heparin Sodium (Porcine) Inj 1000 Unit/ML: INTRAMUSCULAR | Qty: 10 | Status: AC

## 2013-05-26 NOTE — Progress Notes (Signed)
  Cardiologist: Dietrich Pates, MD  Courtesy visit.  Post OP Bilateral LIMA  Will follow.

## 2013-05-26 NOTE — Progress Notes (Signed)
[facility] BRIEF SICU PROGRESS NOTE  1 Day Post-Op  S/P Procedure(s) (LRB): CORONARY ARTERY BYPASS GRAFTING (CABG) (N/A) INTRAOPERATIVE TRANSESOPHAGEAL ECHOCARDIOGRAM (N/A) ENDOVEIN HARVEST OF GREATER SAPHENOUS VEIN (Right)   Stable day NSR w/ stable BP O2 sats 95-100% on 2 L/min UOP adequate  Plan: Continue current plan  Ricardo [name] H 05/26/2013 5:45 PM

## 2013-05-26 NOTE — Progress Notes (Signed)
1 Day Post-Op Procedure(s) (LRB): CORONARY ARTERY BYPASS GRAFTING (CABG) (N/A) INTRAOPERATIVE TRANSESOPHAGEAL ECHOCARDIOGRAM (N/A) ENDOVEIN HARVEST OF GREATER SAPHENOUS VEIN (Right) Subjective:  No complaints  Objective: Vital signs in last 24 hours: Temp:  [94.8 F (34.9 C)-99.5 F (37.5 C)] 97 F (36.1 C) (03/04 0645) Pulse Rate:  [76-115] 76 (03/04 0645) Cardiac Rhythm:  [-] Normal sinus rhythm (03/04 0600) Resp:  [12-39] 17 (03/04 0645) BP: (86-124)/(49-85) 103/62 mmHg (03/04 0600) SpO2:  [93 %-100 %] 99 % (03/04 0645) Arterial Line BP: (82-129)/(46-88) 106/65 mmHg (03/04 0645) FiO2 (%):  [40 %-50 %] 40 % (03/03 1708) Weight:  [100.2 kg (220 lb 14.4 oz)] 100.2 kg (220 lb 14.4 oz) (03/04 0500)  Hemodynamic parameters for last 24 hours: PAP: (19-38)/(10-23) 31/18 mmHg CO:  [3.7 L/min-6 L/min] 6 L/min CI:  [1.8 L/min/m2-2.8 L/min/m2] 2.8 L/min/m2  Intake/Output from previous day: 03/03 0701 - 03/04 0700 In: 4860.7 [I.V.:3630.7; Blood:500; NG/GT:30; IV Piggyback:700] Out: 1610 [[id number] [Urine:4745; Blood:1025; Chest Tube:498] Intake/Output this shift:    General appearance: alert and cooperative Neurologic: intact Heart: regular rate and rhythm, S1, S2 normal, no murmur, click, rub or gallop Lungs: clear to auscultation bilaterally Extremities: extremities normal, atraumatic, no cyanosis or edema Wound: dressing dry Small intermittent air leak from pleural tubes, probably from the right  Lab Results:  Recent Labs  05/25/13 1912 05/25/13 1928 05/26/13 0445  WBC 14.9*  --  14.6*  HGB 13.8 13.9 13.1  HCT 39.0 41.0 37.1*  PLT 178  --  164   BMET:  Recent Labs  05/25/13 0235  05/25/13 1928 05/26/13 0445  NA 140  < > 143 143  K 4.1  < > 5.2 4.6  CL 103  --  106 107  CO2 22  --   --  23  GLUCOSE 103*  < > 129* 101*  BUN 12  --  13 13  CREATININE 0.97  < > 1.10 0.96  CALCIUM 9.7  --   --  8.8  < > = values in this interval not displayed.  PT/INR:  Recent Labs  05/25/13 1405  LABPROT 14.6  INR 1.16   ABG    Component Value Date/Time   PHART 7.341* 05/25/2013 1919   HCO3 23.2 05/25/2013 1919   TCO2 23 05/25/2013 1928   ACIDBASEDEF 3.0* 05/25/2013 1919   O2SAT 95.0 05/25/2013 1919   CBG (last 3)   Recent Labs  05/26/13 0056 05/26/13 0453 05/26/13 0745  GLUCAP 114* 94 114*   CLINICAL DATA: Coronary artery disease  EXAM:  PORTABLE CHEST - 1 VIEW  COMPARISON: May 25, 2013  FINDINGS:  Endotracheal tube and nasogastric tube have been removed. Swan-Ganz  catheter tip is in the right main pulmonary artery. There are 2  chest tubes on the left as well as a mediastinal drain and a chest  tube on the right. Temporary pacemaker wires are attached to the  right heart. No pneumothorax.  There is been significant re-expansion of the right upper lobe  compared to 1 day prior. There is patchy atelectatic change in the  right upper lobe. There is also patchy atelectasis in the left base.  Heart is mildly prominent but stable. Pulmonary vascularity is  normal.  IMPRESSION:  Tube and catheter positions as described. No pneumothorax.  Significant re-expansion of the right upper lobe. Patchy atelectasis  in the right upper lobe and left base are present currently.  Electronically Signed  By: Bretta BangWilliam Woodruff M.D.  On: 05/26/2013 07:25   Assessment/Plan: S/P  Procedure(s) (LRB): CORONARY ARTERY BYPASS GRAFTING (CABG) (N/A) INTRAOPERATIVE TRANSESOPHAGEAL ECHOCARDIOGRAM (N/A) ENDOVEIN HARVEST OF GREATER SAPHENOUS VEIN (Right) Mobilize Diuresis Follow CBG's but no SSI. d/c tubes/lines. Will keep pleural tubes in today and decrease suction to 10 Continue foley due to patient in ICU and urinary output monitoring See progression orders   LOS: 5 days    Ricardo [name] K 05/26/2013

## 2013-05-26 NOTE — Progress Notes (Signed)
Inpatient Diabetes Program Recommendations  AACE/ADA: New Consensus Statement on Inpatient Glycemic Control (2013)  Target Ranges:  Prepandial:   less than 140 mg/dL      Peak postprandial:   less than 180 mg/dL (1-2 hours)      Critically ill patients:  140 - 180 mg/dL   Results for JAHID, APLEY (MRN [id number]) as of 05/26/2013 07:38  Ref. Range 05/25/2013 17:48 05/25/2013 18:53 05/25/2013 19:18 05/26/2013 00:27 05/26/2013 00:30 05/26/2013 00:38 05/26/2013 00:56 05/26/2013 04:53  Glucose-Capillary Latest Range: 70-99 mg/dL 951 (H) 884 (H) 166 (H) 56 (L) 44 (LL) 43 (LL) 114 (H) 94   Diabetes history: NO Outpatient Diabetes medications: NA Current orders for Inpatient glycemic control: Novolog 0-24 units Q4H  Inpatient Diabetes Program Recommendations Correction (SSI): Patient has no history of diabetes and noted hypoglycemia after receiving Novolog 2 units for CBG of 123 mg/dl.  Please consider discontinuing Novolog correction scale.  Thanks, Orlando Penner, RN, MSN, CCRN Diabetes Coordinator Inpatient Diabetes Program [phone] (Team Pager) [phone] ([facility] office) [phone] [facility] office)

## 2013-05-27 ENCOUNTER — Inpatient Hospital Stay (HOSPITAL_COMMUNITY): Payer: Self-pay

## 2013-05-27 LAB — CBC
HCT: 38.8 % — ABNORMAL LOW (ref 39.0–52.0)
Hemoglobin: 13.5 g/dL (ref 13.0–17.0)
MCH: 30.1 pg (ref 26.0–34.0)
MCHC: 34.8 g/dL (ref 30.0–36.0)
MCV: 86.6 fL (ref 78.0–100.0)
PLATELETS: 173 10*3/uL (ref 150–400)
RBC: 4.48 MIL/uL (ref 4.22–5.81)
RDW: 14 % (ref 11.5–15.5)
WBC: 14 10*3/uL — AB (ref 4.0–10.5)

## 2013-05-27 LAB — BASIC METABOLIC PANEL
BUN: 11 mg/dL (ref 6–23)
CALCIUM: 9.1 mg/dL (ref 8.4–10.5)
CO2: 27 meq/L (ref 19–32)
CREATININE: 0.98 mg/dL (ref 0.50–1.35)
Chloride: 100 mEq/L (ref 96–112)
GFR calc Af Amer: >90 mL/min (ref >90)
Glucose, Bld: 137 mg/dL — ABNORMAL HIGH (ref 70–99)
Potassium: 3.6 mEq/L — ABNORMAL LOW (ref 3.7–5.3)
Sodium: 140 mEq/L (ref 137–147)

## 2013-05-27 LAB — GLUCOSE, CAPILLARY
GLUCOSE-CAPILLARY: 98 mg/dL (ref 70–99)
Glucose-Capillary: 109 mg/dL — ABNORMAL HIGH (ref 70–99)
Glucose-Capillary: 121 mg/dL — ABNORMAL HIGH (ref 70–99)
Glucose-Capillary: 138 mg/dL — ABNORMAL HIGH (ref 70–99)

## 2013-05-27 MED ORDER — METOPROLOL TARTRATE 25 MG PO TABS
25.0000 mg | ORAL_TABLET | Freq: Two times a day (BID) | ORAL | Status: DC
  Administered 2013-05-27 – 2013-05-29 (×4): 25 mg via ORAL
  Filled 2013-05-27 (×5): qty 1

## 2013-05-27 MED ORDER — ACETAMINOPHEN 325 MG PO TABS: 650.0000 mg | ORAL_TABLET | Freq: Four times a day (QID) | ORAL | Status: DC | PRN

## 2013-05-27 MED ORDER — METOPROLOL TARTRATE 25 MG PO TABS: 25.0000 mg | ORAL_TABLET | Freq: Two times a day (BID) | ORAL | Status: DC

## 2013-05-27 MED ORDER — POTASSIUM CHLORIDE 10 MEQ/50ML IV SOLN
10.0000 meq | INTRAVENOUS | Status: DC | PRN
  Administered 2013-05-27 (×2): 10 meq via INTRAVENOUS
  Filled 2013-05-27 (×4): qty 50

## 2013-05-27 MED ORDER — MOVING RIGHT ALONG BOOK
Freq: Once | Status: AC
  Administered 2013-05-28: 11:00:00
  Filled 2013-05-27: qty 1

## 2013-05-27 MED ORDER — FUROSEMIDE 40 MG PO TABS
40.0000 mg | ORAL_TABLET | Freq: Every day | ORAL | Status: AC
  Administered 2013-05-27 – 2013-05-29 (×3): 40 mg via ORAL
  Filled 2013-05-27 (×3): qty 1

## 2013-05-27 MED ORDER — TRAMADOL HCL 50 MG PO TABS
50.0000 mg | ORAL_TABLET | ORAL | Status: DC | PRN
  Administered 2013-05-27 – 2013-05-28 (×2): 50 mg via ORAL
  Filled 2013-05-27 (×3): qty 1

## 2013-05-27 MED ORDER — ONDANSETRON HCL 4 MG PO TABS: 4.0000 mg | ORAL_TABLET | Freq: Four times a day (QID) | ORAL | Status: DC | PRN

## 2013-05-27 MED ORDER — BISACODYL 10 MG RE SUPP: 10.0000 mg | Freq: Every day | RECTAL | Status: DC | PRN

## 2013-05-27 MED ORDER — ALUM & MAG HYDROXIDE-SIMETH 200-200-20 MG/5ML PO SUSP
30.0000 mL | ORAL | Status: DC | PRN
  Filled 2013-05-27 (×2): qty 30

## 2013-05-27 MED ORDER — FAMOTIDINE 20 MG PO TABS
20.0000 mg | ORAL_TABLET | Freq: Two times a day (BID) | ORAL | Status: DC
  Administered 2013-05-27 – 2013-05-29 (×4): 20 mg via ORAL
  Filled 2013-05-27 (×6): qty 1

## 2013-05-27 MED ORDER — SODIUM CHLORIDE 0.9 % IJ SOLN: 3.0000 mL | INTRAMUSCULAR | Status: DC | PRN

## 2013-05-27 MED ORDER — SODIUM CHLORIDE 0.9 % IJ SOLN
3.0000 mL | Freq: Two times a day (BID) | INTRAMUSCULAR | Status: DC
  Administered 2013-05-27 – 2013-05-28 (×2): 3 mL via INTRAVENOUS

## 2013-05-27 MED ORDER — OXYCODONE HCL 5 MG PO TABS: 5.0000 mg | ORAL_TABLET | ORAL | Status: DC | PRN

## 2013-05-27 MED ORDER — BISACODYL 5 MG PO TBEC
10.0000 mg | DELAYED_RELEASE_TABLET | Freq: Every day | ORAL | Status: DC | PRN
  Administered 2013-05-28: 10 mg via ORAL
  Filled 2013-05-27: qty 2

## 2013-05-27 MED ORDER — POTASSIUM CHLORIDE CRYS ER 20 MEQ PO TBCR
40.0000 meq | EXTENDED_RELEASE_TABLET | Freq: Every day | ORAL | Status: AC
  Administered 2013-05-27 – 2013-05-29 (×3): 40 meq via ORAL
  Filled 2013-05-27 (×4): qty 2

## 2013-05-27 MED ORDER — SODIUM CHLORIDE 0.9 % IV SOLN: 250.0000 mL | INTRAVENOUS | Status: DC | PRN

## 2013-05-27 MED ORDER — ASPIRIN EC 325 MG PO TBEC
325.0000 mg | DELAYED_RELEASE_TABLET | Freq: Every day | ORAL | Status: DC
  Administered 2013-05-28 – 2013-05-29 (×2): 325 mg via ORAL
  Filled 2013-05-27 (×2): qty 1

## 2013-05-27 MED ORDER — ONDANSETRON HCL 4 MG/2ML IJ SOLN: 4.0000 mg | Freq: Four times a day (QID) | INTRAMUSCULAR | Status: DC | PRN

## 2013-05-27 MED ORDER — DOCUSATE SODIUM 100 MG PO CAPS
200.0000 mg | ORAL_CAPSULE | Freq: Every day | ORAL | Status: DC
  Administered 2013-05-28: 200 mg via ORAL
  Filled 2013-05-27 (×2): qty 2

## 2013-05-27 NOTE — Progress Notes (Signed)
1455 Cardiac Rehab Came to ambulate pt he states that he has walked 3 times today. States he could not sleep last night and started at 4 am,next one at 6am and walked from his room on [address] to here. He c/o of feeling tired now. We will follow pt tomorrow. Beatrix Fetters, RN 05/27/2013 3:12 PM

## 2013-05-27 NOTE — Progress Notes (Signed)
2 Days Post-Op Procedure(s) (LRB): CORONARY ARTERY BYPASS GRAFTING (CABG) (N/A) INTRAOPERATIVE TRANSESOPHAGEAL ECHOCARDIOGRAM (N/A) ENDOVEIN HARVEST OF GREATER SAPHENOUS VEIN (Right) Subjective: Gas pains  Objective: Vital signs in last 24 hours: Temp:  [97.5 F (36.4 C)-99.1 F (37.3 C)] 97.8 F (36.6 C) (03/05 0743) Pulse Rate:  [80-102] 91 (03/05 0600) Cardiac Rhythm:  [-] Normal sinus rhythm (03/05 0600) Resp:  [9-34] 9 (03/05 0600) BP: (108-148)/(57-91) 140/90 mmHg (03/05 0600) SpO2:  [91 %-100 %] 98 % (03/05 0600) Arterial Line BP: (131-157)/(74-80) 147/79 mmHg (03/04 1300) Weight:  [100.2 kg (220 lb 14.4 oz)] 100.2 kg (220 lb 14.4 oz) (03/05 0304)  Hemodynamic parameters for last 24 hours: PAP: (34-39)/(19-21) 34/19 mmHg CO:  [4.3 L/min] 4.3 L/min CI:  [2 L/min/m2] 2 L/min/m2  Intake/Output from previous day: 03/04 0701 - 03/05 0700 In: 1600 [P.O.:960; I.V.:540; IV Piggyback:100] Out: 2324 [Urine:2110; Chest Tube:214] Intake/Output this shift:    General appearance: alert and cooperative Neurologic: intact Heart: regular rate and rhythm, S1, S2 normal, no murmur, click, rub or gallop Lungs: clear to auscultation bilaterally Abdomen: soft, non-tender; bowel sounds normal; no masses,  no organomegaly Extremities: extremities normal, atraumatic, no cyanosis or edema Wound: incision ok  Lab Results:  Recent Labs  05/26/13 1700 05/27/13 0450  WBC 13.8* 14.0*  HGB 13.5 13.5  HCT 38.3* 38.8*  PLT 167 173   BMET:  Recent Labs  05/26/13 0445 05/26/13 1700 05/27/13 0450  NA 143  --  140  K 4.6  --  3.6*  CL 107  --  100  CO2 23  --  27  GLUCOSE 101*  --  137*  BUN 13  --  11  CREATININE 0.96 1.02 0.98  CALCIUM 8.8  --  9.1    PT/INR:  Recent Labs  05/25/13 1405  LABPROT 14.6  INR 1.16   ABG    Component Value Date/Time   PHART 7.341* 05/25/2013 1919   HCO3 23.2 05/25/2013 1919   TCO2 23 05/25/2013 1928   ACIDBASEDEF 3.0* 05/25/2013 1919   O2SAT  95.0 05/25/2013 1919   CBG (last 3)   Recent Labs  05/26/13 1730 05/27/13 0040 05/27/13 0416  GLUCAP 98 121* 138*    Assessment/Plan: S/P Procedure(s) (LRB): CORONARY ARTERY BYPASS GRAFTING (CABG) (N/A) INTRAOPERATIVE TRANSESOPHAGEAL ECHOCARDIOGRAM (N/A) ENDOVEIN HARVEST OF GREATER SAPHENOUS VEIN (Right) Mobilize Diuresis d/c tubes/lines Plan for transfer to step-down: see transfer orders   LOS: 6 days    BARTLE,BRYAN K 05/27/2013

## 2013-05-27 NOTE — Progress Notes (Signed)
Pt offered Maalox for indigestion. Pt refused medication.

## 2013-05-27 NOTE — Progress Notes (Signed)
CALLED DR. HENDRICKSON FOR ORDER FOR INDIGESTION. ORDERS GIVEN PER DR. HENDRICKSON.

## 2013-05-28 ENCOUNTER — Inpatient Hospital Stay (HOSPITAL_COMMUNITY): Payer: Self-pay

## 2013-05-28 MED ORDER — LACTULOSE 10 GM/15ML PO SOLN
30.0000 g | Freq: Every day | ORAL | Status: DC | PRN
  Filled 2013-05-28: qty 45

## 2013-05-28 MED ORDER — SIMETHICONE 80 MG PO CHEW
80.0000 mg | CHEWABLE_TABLET | Freq: Four times a day (QID) | ORAL | Status: DC | PRN
  Filled 2013-05-28: qty 1

## 2013-05-28 NOTE — Progress Notes (Signed)
DC EPW per MD orders and protocol; vitals Q15 for 1 hour; pt on bedrest for 1 hour; family at bedside; call bell in reach; will continue to monitor.  Park Breed, RN

## 2013-05-28 NOTE — Discharge Instructions (Signed)
Coronary Artery Bypass Grafting, Care After °Refer to this sheet in the next few weeks. These instructions provide you with information on caring for yourself after your procedure. Your health care provider may also give you more specific instructions. Your treatment has been planned according to current medical practices, but problems sometimes occur. Call your health care provider if you have any problems or questions after your procedure. °WHAT TO EXPECT AFTER THE PROCEDURE °Recovery from surgery will be different for everyone. Some people feel well after 3 or 4 weeks, while for others it takes longer. After your procedure, it is typical to have the following: °· Nausea and a lack of appetite.   °· Constipation. °· Weakness and fatigue.   °· Depression or irritability.   °· Pain or discomfort at your incision site. °HOME CARE INSTRUCTIONS °· Only take over-the-counter or prescription medicines as directed by your health care provider. Take all medicines exactly as directed. Do not stop taking medicines or start any new medicines without first checking with your health care provider.   °· Take your pulse as directed by your health care provider. °· Perform deep breathing as directed by your health care provider. If you were given a device called an incentive spirometer, use it to practice deep breathing several times a day. Support your chest with a pillow or your arms when you take deep breaths or cough. °· Keep incision areas clean, dry, and protected. Remove or change any bandages (dressings) only as directed by your health care provider. You may have skin adhesive strips over the incision areas. Do not take the strips off. They will fall off on their own. °· Check incision areas daily for any swelling, redness, or drainage. °· If incisions were made in your legs, do the following: °· Avoid crossing your legs.   °· Avoid sitting for long periods of time. Change positions every 30 minutes.   °· Elevate your legs  when you are sitting.   °· Wear compression stockings as directed by your health care provider. These stockings help keep blood clots from forming in your legs. °· Take showers once your health care provider approves. Until then, only take sponge baths. Pat incisions dry. Do not rub incisions with a washcloth or towel. Do not take tub baths or go swimming until your health care provider approves. °· Eat foods that are high in fiber, such as raw fruits and vegetables, whole grains, beans, and nuts. Meats should be lean cut. Avoid canned, processed, and fried foods. °· Drink enough fluids to keep your urine clear or pale yellow. °· Weigh yourself every day. This helps identify if you are retaining fluid that may make your heart and lungs work harder.   °· Rest and limit activity as directed by your health care provider. You may be instructed to: °· Stop any activity at once if you have chest pain, shortness of breath, irregular heartbeats, or dizziness. Get help right away if you have any of these symptoms. °· Move around frequently for short periods or take short walks as directed by your health care provider. Increase your activities gradually. You may need physical therapy or cardiac rehabilitation to help strengthen your muscles and build your endurance. °· Avoid lifting, pushing, or pulling anything heavier than 10 lb (4.5 kg) for at least 6 weeks after surgery. °· Do not drive until your health care provider approves.  °· Ask your health care provider when you may return to work and resume sexual activity. °· Follow up with your health care provider as   directed.   °SEEK MEDICAL CARE IF: °· You have swelling, redness, increasing pain, or drainage at the site of an incision.   °· You develop a fever.   °· You have swelling in your ankles or legs.   °· You have pain in your legs.   °· You have weight gain of 2 or more pounds a day. °· You are nauseous or vomit. °· You have diarrhea.  °SEEK IMMEDIATE MEDICAL CARE  IF: °· You have chest pain that goes to your jaw or arms. °· You have shortness of breath.   °· You have a fast or irregular heartbeat.   °· You notice a "clicking" in your breastbone (sternum) when you move.   °· You have numbness or weakness in your arms or legs. °· You feel dizzy or lightheaded.   °MAKE SURE YOU: °· Understand these instructions. °· Will watch your condition. °· Will get help right away if you are not doing well or get worse. °Document Released: 09/28/2004 Document Revised: 11/11/2012 Document Reviewed: 08/18/2012 °[employer]® Patient Information ©2014 [employer]. ° °Endoscopic Saphenous Vein Harvesting °Care After °Refer to this sheet in the next few weeks. These instructions provide you with information on caring for yourself after your procedure. Your caregiver may also give you more specific instructions. Your treatment has been planned according to current medical practices, but problems sometimes occur. Call your caregiver if you have any problems or questions after your procedure. °HOME CARE INSTRUCTIONS °Medicine °· Take whatever pain medicine your surgeon prescribes. Follow the directions carefully. Do not take over-the-counter pain medicine unless your surgeon says it is okay. Some pain medicine can cause bleeding problems for several weeks after surgery. °· Follow your surgeon's instructions about driving. You will probably not be permitted to drive after heart surgery. °· Take any medicines your surgeon prescribes. Any medicines you took before your heart surgery should be checked with your caregiver before you start taking them again. °Wound care °· Ask your surgeon how long you should keep wearing your elastic bandage or stocking. °· Check the area around your surgical cuts (incisions) whenever your bandages (dressings) are changed. Look for any redness or swelling. °· You will need to return to have the stitches (sutures) or staples taken out. Ask your surgeon when to do  that. °· Ask your surgeon when you can shower or bathe. °Activity °· Try to keep your legs raised when you are sitting. °· Do any exercises your caregivers have given you. These may include deep breathing exercises, coughing, walking, or other exercises. °SEEK MEDICAL CARE IF: °· You have any questions about your medicines. °· You have more leg pain, especially if your pain medicine stops working. °· New or growing bruises develop on your leg. °· Your leg swells, feels tight, or becomes red. °· You have numbness in your leg. °SEEK IMMEDIATE MEDICAL CARE IF: °· Your pain gets much worse. °· Blood or fluid leaks from any of the incisions. °· Your incisions become warm, swollen, or red. °· You have chest pain. °· You have trouble breathing. °· You have a fever. °· You have more pain near your leg incision. °MAKE SURE YOU: °· Understand these instructions. °· Will watch your condition. °· Will get help right away if you are not doing well or get worse. °Document Released: 11/21/2010 Document Revised: 06/03/2011 Document Reviewed: 11/21/2010 °[employer]® Patient Information ©2014 [employer]. ° ° °

## 2013-05-28 NOTE — Progress Notes (Signed)
Pt's family member was concerned about swelling in right leg, pulses, cap refill; assessed pt-pt has +2 pulses in both feet, cap refill is less than 3 seconds, pt does complain of right ankle pain, notified MD, no new orders; will continue to monitor.  Park Breed, RN

## 2013-05-28 NOTE — Progress Notes (Signed)
      [address]       Ricardo [name] [address]             [phone]      3 Days Post-Op Procedure(s) (LRB): CORONARY ARTERY BYPASS GRAFTING (CABG) (N/A) INTRAOPERATIVE TRANSESOPHAGEAL ECHOCARDIOGRAM (N/A) ENDOVEIN HARVEST OF GREATER SAPHENOUS VEIN (Right)  Subjective:  Ricardo [name] has no complaints.  He feels pretty good.  He is ambulating with no difficulty.  No BM, but complains of a lot of gas pains.  Objective: Vital signs in last 24 hours: Temp:  [98.4 F (36.9 C)-99.5 F (37.5 C)] 98.9 F (37.2 C) (03/06 0426) Pulse Rate:  [79-103] 90 (03/06 0426) Cardiac Rhythm:  [-] Normal sinus rhythm (03/05 2000) Resp:  [18-26] 18 (03/06 0426) BP: (117-158)/(68-93) 117/72 mmHg (03/06 0426) SpO2:  [93 %-100 %] 96 % (03/06 0426) Weight:  [218 lb 11.1 oz (99.2 kg)] 218 lb 11.1 oz (99.2 kg) (03/06 0426)   Intake/Output from previous day: 03/05 0701 - 03/06 0700 In: 310 [P.O.:240; I.V.:20; IV Piggyback:50] Out: 545 [Urine:525; Chest Tube:20]  General appearance: alert, cooperative and no distress Heart: regular rate and rhythm Lungs: clear to auscultation bilaterally Abdomen: soft, non-tender; bowel sounds normal; no masses,  no organomegaly Extremities: edema trace Wound: clean and dry  Lab Results:  Recent Labs  05/26/13 1700 05/27/13 0450  WBC 13.8* 14.0*  HGB 13.5 13.5  HCT 38.3* 38.8*  PLT 167 173   BMET:  Recent Labs  05/26/13 0445 05/26/13 1700 05/27/13 0450  NA 143  --  140  K 4.6  --  3.6*  CL 107  --  100  CO2 23  --  27  GLUCOSE 101*  --  137*  BUN 13  --  11  CREATININE 0.96 1.02 0.98  CALCIUM 8.8  --  9.1    PT/INR:  Recent Labs  05/25/13 1405  LABPROT 14.6  INR 1.16   ABG    Component Value Date/Time   PHART 7.341* 05/25/2013 1919   HCO3 23.2 05/25/2013 1919   TCO2 23 05/25/2013 1928   ACIDBASEDEF 3.0* 05/25/2013 1919   O2SAT 95.0 05/25/2013 1919   CBG (last 3)   Recent Labs  05/27/13 0040 05/27/13 0416 05/27/13 0741    GLUCAP 121* 138* 109*    Assessment/Plan: S/P Procedure(s) (LRB): CORONARY ARTERY BYPASS GRAFTING (CABG) (N/A) INTRAOPERATIVE TRANSESOPHAGEAL ECHOCARDIOGRAM (N/A) ENDOVEIN HARVEST OF GREATER SAPHENOUS VEIN (Right)  1. CV- NSR rate in the 90s, good blood pressure control- on Lopressor 2. Pulm- no issues, encouraged use of IS 3. Renal- mildly volume overloaded, weight is elevated 2 lbs since admission, on Lasix 4. LOC Constipation- will order lactulose prn, Simethicone for gas relief 5. Dispo- patient looks great, will d/c EPW today, plan for d/c in AM   LOS: 7 days    BARRETT, ERIN 05/28/2013

## 2013-05-28 NOTE — Discharge Summary (Signed)
Physician Discharge Summary  Patient ID: JATAVIOUS PEPPARD MRN: [id number] DOB/AGE: 35-Mar-1982 35 y.o.  Admit date: 05/21/2013 Discharge date: 05/28/2013    Admission Diagnoses:  Patient Active Problem List   Diagnosis Date Noted  . NSTEMI (non-ST elevated myocardial infarction) 05/21/2013    Discharge Diagnoses:   Patient Active Problem List   Diagnosis Date Noted  . S/P CABG x 3 05/25/2013  . NSTEMI (non-ST elevated myocardial infarction) 05/21/2013     Discharged Condition: good    History of Present Illness:   Mr. Delman is a 33 yo African American male with strong family history of premature CAD and nicotine abuse.  The patient has a long standing history of heart burn type pain and belching which he attributed to reflux.  However, early this week the patient developed substernal chest pain with radiation down his right arm and into his hand.  The pain continued to worsen and he presented to the Emergency Department for evaluation.  Upon arrival he was ruled in for NSTEMI with Troponin of 11.74.  He was admitted by Cardiology for further care.    Hospital Course:   Upon admission patient remained chest pain free.  Cardiac catheterization was performed and showed severe 3 vessel CAD.  It was felt coronary bypass grafting would be his best treatment option.  He was placed on Heparin and TCTS was consulted.  The patient was evaluated by Dr. Laneta Simmers on 05/22/13 at which time he was in agreement the patient would benefit from Coronary bypass grafting procedure.  The risks and benefits of the procedure were explained to the patient and he was agreeable to proceed.  The patient was taken to the operating room on 05/25/2013.  Her underwent CABG x 4 utilizing LIMA to LAD, Sequential SVG to Ramus Intermediate and OM 1, and SVG to PDA.  He also underwent Endoscopic saphenous vein harvest from his right leg.  He tolerated the procedure well and was taken to the SICU in stable condition.  He was  extubated the evening of surgery.  During his stay in the ICU he was weaned off all drip as tolerated.  His chest tubes and arterial lines were removed without difficulty.  He was ambulating independently and transferred to the step down unit in stable condition.  The patient continues to progress.  He is maintaining NSR and his pacing wires have been removed without difficulty.  He is tolerating a cardiac diet.  He continues to ambulate independently.  Should no further issues arise we anticipate discharge home in the next 24-48 hours.  He will follow up with Dr. Laneta Simmers in 3 weeks with a CXR prior to his appointment.      Significant Diagnostic Studies: angiography:   Hemodynamics:  AO 117/80 mean 98 mm Hg  LV 120/17 mm Hg  Coronary angiography:  Coronary dominance: right  Left mainstem: Normal.  Left anterior descending (LAD): There is segmental 50-70% disease in the proximal LAD. The first diagonal has a 90% ostial stenosis then is subtotally occluded in the mid vessel.  Ramus intermediate: This is a very large vessel with 90% ostial stenosis.  Left circumflex (LCx): The LCx has diffuse 70% disease in the proximal vessel and then is occluded in the mid vessel. There are extensive bridging collaterals to the distal vessel with a large OM.  Right coronary artery (RCA): The RCA is occluded in the mid vessel with right to right and left to right collaterals.  Left ventriculography: Left ventricular systolic function  is abnormal, there is global hypokinesis with severe hypokinesis of the anterior wall. LVEF is estimated at 30-35%. There is mild to moderate mitral regurgitation     Treatments: surgery:   1. Median Sternotomy 2. Extracorporeal circulation       3. Coronary artery bypass grafting x 4  Left internal mammary graft to the LAD  Sequential SVG to Ramus and OM  SVG to PDA       4. Endoscopic vein harvest from the right leg    Disposition: 01-Home or Self  Care     Discharge Medications:    Medication List    STOP taking these medications       naproxen sodium 220 MG tablet  Commonly known as:  ANAPROX      TAKE these medications       aspirin 325 MG EC tablet  Take 1 tablet (325 mg total) by mouth daily.     lisinopril 10 MG tablet  Commonly known as:  PRINIVIL,ZESTRIL  Take 1 tablet (10 mg total) by mouth daily.     metoprolol tartrate 25 MG tablet  Commonly known as:  LOPRESSOR  Take 1 tablet (25 mg total) by mouth 2 (two) times daily.     oxyCODONE 5 MG immediate release tablet  Commonly known as:  Oxy IR/ROXICODONE  Take 1-2 tablets (5-10 mg total) by mouth every 3 (three) hours as needed for severe pain.     pravastatin 10 MG tablet  Commonly known as:  PRAVACHOL  Take 1 tablet (10 mg total) by mouth daily.     ranitidine 150 MG tablet  Commonly known as:  ZANTAC  Take 300 mg by mouth 2 (two) times daily.        The patient has been discharged on:   1.Beta Blocker:  Yes [ x  ]                              No   [   ]                              If No, reason:  2.Ace Inhibitor/ARB: Yes [  x ]                                     No  [    ]                                     If No, reason:  3.Statin:   Yes [x   ]                  No  [   ]                  If No, reason:  4.Ecasa:  Yes  [ x  ]                  No   [   ]                  If No, reason:      Future Appointments Provider Department Dept Phone   06/02/2013 12:00 PM Gaylord Shih, MD [facility] [phone]   06/16/2013 2:30 PM Alleen BorneBryan K Bartle, MD [facility] [phone]         Follow-up Information   Follow up with [facility]    . (Appt on Wednesday, March 11th @ 12:00 p.m. )    Contact information:   [address] [place] [id number] [phone]      Follow up with Alleen BorneBARTLE,BRYAN K, MD On 06/16/2013. (Appointment  is at 2:30)    Specialty:  Cardiothoracic Surgery   Contact information:   [address] [place] [address] [phone]       Follow up with [facility] On 06/16/2013. (Please get CXR at 1:30)    Contact information:   [place]       Signed: BARRETT, ERIN 05/28/2013, 9:34 AM

## 2013-05-28 NOTE — Progress Notes (Signed)
CARDIAC REHAB PHASE I   PRE:  Rate/Rhythm: 90 SR  BP:  Supine: 130/70  Sitting:   Standing:    SaO2: 97 RA  MODE:  Ambulation: 690 ft   POST:  Rate/Rhythm: 96  BP:  Supine:   Sitting: 130/90  Standing:    SaO2: 98 RA 1425-1325 Pt tolerated ambulation well without c/o other hiccups and abd pain.Gait steady VS stable. Pt to recliner after walk with call light in reach. Started education with pt. He had to go to bathroom to have BM before education was completed . We will follow in the am to complete education. I encouraged pt to watch recovery from heart surgery video with wife tonight. Discussed smoking cessation with pt. I gave his tips for quitting, quit smart class contact number and coaching contact number. Pt states that he has quit smoking, seems very motivated.  Melina Copa RN 05/28/2013 3:28 PM

## 2013-05-28 NOTE — Progress Notes (Signed)
With NSTEMI consider Plavix for one year.

## 2013-05-29 MED ORDER — METOPROLOL TARTRATE 25 MG PO TABS: 25.0000 mg | ORAL_TABLET | Freq: Two times a day (BID) | ORAL | Status: DC

## 2013-05-29 MED ORDER — ASPIRIN 325 MG PO TBEC: 325.0000 mg | DELAYED_RELEASE_TABLET | Freq: Every day | ORAL | Status: DC

## 2013-05-29 MED ORDER — OXYCODONE HCL 5 MG PO TABS: 5.0000 mg | ORAL_TABLET | ORAL | Status: DC | PRN

## 2013-05-29 MED ORDER — LISINOPRIL 10 MG PO TABS: 10.0000 mg | ORAL_TABLET | Freq: Every day | ORAL | Status: DC

## 2013-05-29 MED ORDER — PRAVASTATIN SODIUM 10 MG PO TABS: 10.0000 mg | ORAL_TABLET | Freq: Every day | ORAL | Status: DC

## 2013-05-29 NOTE — Progress Notes (Deleted)
   CARE MANAGEMENT NOTE 05/29/2013  Patient:  Ricardo [name],Ricardo [name]   Account Number:  [id number]  Date Initiated:  05/28/2013  Documentation initiated by:  DOWELL,DEBBIE  Subjective/Objective Assessment:   adm w ch pain     Action/Plan:   lives w wife, pcp dr clark   Anticipated DC Date:     Anticipated DC Plan:    In-house referral  Clinical Social Worker      DC Planning Services  CM consult      PAC Choice  HOME HEALTH   Choice offered to / List presented to:     DME arranged  BEDSIDE COMMODE      DME agency  [facility]     HH arranged  HH-1 RN  HH-2 PT  HH-4 NURSE'S AIDE  HH-6 SOCIAL WORKER      HH agency  [facility]   Status of service:  Completed, signed off Medicare Important Message given?   (If response is "NO", the following Medicare IM given date fields will be blank) Date Medicare IM given:   Date Additional Medicare IM given:    Discharge Disposition:  HOME W HOME HEALTH SERVICES  Per UR Regulation:  Reviewed for med. necessity/level of care/duration of stay  If discussed at Long Length of Stay Meetings, dates discussed:    Comments:  05/29/13 09:30 CM received call from RN as pt has decided to go home with [facility] HHPT/RN/SW/aide.  CM spoke with pt in room to verify address and contact numbers.  [facility] rep, [place], notified of referral.  Bedside commode will be delivered to room prior to discharge.  No other CM needs were communicated.  Erine Phenix, BSN, CM [phone].  3/6 poss dc home, nse states is weak. phy ther eval obtained. pt states he wouldn't mind snf short term if ins will cover. if ins will not cover he would like [facility]. await phy ther rec. soc work ref made also.  

## 2013-05-29 NOTE — Progress Notes (Signed)
IV and tele monitor d/c at this time; d/c instructions given to pt; pt to d/c home with girlfriend; will cont. To monitor.

## 2013-05-29 NOTE — Progress Notes (Signed)
       [address]       [place] [address]             [phone]          4 Days Post-Op Procedure(s) (LRB): CORONARY ARTERY BYPASS GRAFTING (CABG) (N/A) INTRAOPERATIVE TRANSESOPHAGEAL ECHOCARDIOGRAM (N/A) ENDOVEIN HARVEST OF GREATER SAPHENOUS VEIN (Right)  Subjective: No complaints this am.   Objective: Vital signs in last 24 hours: Patient Vitals for the past 24 hrs:  BP Temp Temp src Pulse Resp SpO2 Weight  05/29/13 0451 107/69 mmHg 98.3 F (36.8 C) Oral 91 18 97 % 214 lb 8.1 oz (97.3 kg)  05/28/13 2017 134/83 mmHg 98.8 F (37.1 C) Oral 95 18 98 % -  05/28/13 1438 130/70 mmHg 98.5 F (36.9 C) Oral 90 20 97 % -  05/28/13 1230 135/82 mmHg - - 91 - - -  05/28/13 1215 132/82 mmHg - - 92 - - -  05/28/13 1200 133/73 mmHg - - 82 - - -  05/28/13 1145 133/78 mmHg - - 87 - - -  05/28/13 1134 143/75 mmHg - - 84 - - -   Current Weight  05/29/13 214 lb 8.1 oz (97.3 kg)     Intake/Output from previous day: 03/06 0701 - 03/07 0700 In: 600 [P.O.:600] Out: -     PHYSICAL EXAM:  Heart: RRR Lungs: Clear Wound: Clean and dry Extremities: Trace LE edema    Lab Results: CBC: Recent Labs  05/26/13 1700 05/27/13 0450  WBC 13.8* 14.0*  HGB 13.5 13.5  HCT 38.3* 38.8*  PLT 167 173   BMET:  Recent Labs  05/26/13 1700 05/27/13 0450  NA  --  140  K  --  3.6*  CL  --  100  CO2  --  27  GLUCOSE  --  137*  BUN  --  11  CREATININE 1.02 0.98  CALCIUM  --  9.1    PT/INR: No results found for this basename: LABPROT, INR,  in the last 72 hours    Assessment/Plan: S/P Procedure(s) (LRB): CORONARY ARTERY BYPASS GRAFTING (CABG) (N/A) INTRAOPERATIVE TRANSESOPHAGEAL ECHOCARDIOGRAM (N/A) ENDOVEIN HARVEST OF GREATER SAPHENOUS VEIN (Right) Stable, doing well. Plan d/c home today- instructions reviewed with patient and family.   LOS: 8 days    Mylan Lengyel H 05/29/2013

## 2013-05-29 NOTE — Progress Notes (Signed)
   CARE MANAGEMENT NOTE 05/29/2013  Patient:  Ricardo [name], Ricardo [name]   Account Number:  [id number]  Date Initiated:  05/24/2013  Documentation initiated by:  Junius Creamer  Subjective/Objective Assessment:   adm w mi     Action/Plan:   lives w fam   Anticipated DC Date:  05/29/2013   Anticipated DC Plan:  HOME/SELF CARE      DC Planning Services  CM consult  MATCH Program      Choice offered to / List presented to:             Status of service:  Completed, signed off Medicare Important Message given?   (If response is "NO", the following Medicare IM given date fields will be blank) Date Medicare IM given:   Date Additional Medicare IM given:    Discharge Disposition:  HOME/SELF CARE  Per UR Regulation:  Reviewed for med. necessity/level of care/duration of stay  If discussed at Long Length of Stay Meetings, dates discussed:   05/27/2013    Comments:  05/29/13 09:15 CM gave pt MATCH letter with a list of participating pharmacies and explained parameters of MATCH program.  Pt verbalized understanding of MATCH program.  CM gave pt handout for [facility] and encouraged him to call for an orange card appt and to secure a PCP.  No other CM needs were communicated.  Freddy Jaksch, BSN, [place] [phone].  05/27/13 1131 Henrietta Mayo RN MSN BSN CCM Pt has no insurance or PCP, would like to follow @ [facility] [employer].  [facility]. 1149 Pt has appointment with Cardiology Clinic @ [facility] on Wednesday, March 11th @ 12 p.m.  Information provided to pt and significant other.

## 2013-05-29 NOTE — Progress Notes (Signed)
CARDIAC REHAB PHASE I   PRE:  Rate/Rhythm: Sinus Rhythm  BP:  Supine: 142/92       SaO2: 98% Room Air  MODE:  Ambulation: 500 ft   POST:  Rate/Rhythem: 88  BP:    Sitting: 142/90     SaO2: 97% Room Air  [phone]  Patient ambulated in hallway without complaints. Exercise instructions reviewed. Patient is interested in outpatient cardiac rehab. Patient given application for financial assistance.  Glenna Brunkow, Arta Bruce RN BSN

## 2013-06-02 ENCOUNTER — Encounter: Payer: Self-pay | Admitting: Cardiology

## 2013-06-02 ENCOUNTER — Ambulatory Visit: Payer: Self-pay | Attending: Cardiology | Admitting: Cardiology

## 2013-06-02 VITALS — BP 101/68 | HR 93 | Temp 97.9°F | Resp 18 | Ht 67.0 in | Wt 216.0 lb

## 2013-06-02 DIAGNOSIS — R209 Unspecified disturbances of skin sensation: Secondary | ICD-10-CM | POA: Insufficient documentation

## 2013-06-02 DIAGNOSIS — Z951 Presence of aortocoronary bypass graft: Secondary | ICD-10-CM | POA: Insufficient documentation

## 2013-06-02 DIAGNOSIS — E785 Hyperlipidemia, unspecified: Secondary | ICD-10-CM | POA: Insufficient documentation

## 2013-06-02 DIAGNOSIS — I5022 Chronic systolic (congestive) heart failure: Secondary | ICD-10-CM | POA: Insufficient documentation

## 2013-06-02 DIAGNOSIS — Z87891 Personal history of nicotine dependence: Secondary | ICD-10-CM | POA: Insufficient documentation

## 2013-06-02 DIAGNOSIS — I2510 Atherosclerotic heart disease of native coronary artery without angina pectoris: Secondary | ICD-10-CM | POA: Insufficient documentation

## 2013-06-02 NOTE — Progress Notes (Signed)
HPI [name] returns today after having coronary artery bypass grafting for three-vessel coronary artery disease. He presented with a non-STEMI. Ejection fraction was 30-35% prior to surgery.  Risk factors include sedentary lifestyle, obesity, tobacco use which he quit, hyperlipidemia, and strong family history. He is now being compliant with his medications.  Other than some clear drainage from his sternotomy site he has no complaints. He does have some right lower leg numbness medially from the vein graft harvesting.   He is not enrolled in cardiac rehabilitation yet.  Past Medical History  Diagnosis Date  . Migraine   . Hypertension   . Renal disorder     Current Outpatient Prescriptions  Medication Sig Dispense Refill  . lisinopril (PRINIVIL,ZESTRIL) 10 MG tablet Take 1 tablet (10 mg total) by mouth daily.  30 tablet  1  . metoprolol tartrate (LOPRESSOR) 25 MG tablet Take 1 tablet (25 mg total) by mouth 2 (two) times daily.  60 tablet  1  . oxyCODONE (OXY IR/ROXICODONE) 5 MG immediate release tablet Take 1-2 tablets (5-10 mg total) by mouth every 3 (three) hours as needed for severe pain.  40 tablet  0  . pravastatin (PRAVACHOL) 10 MG tablet Take 1 tablet (10 mg total) by mouth daily.  30 tablet  1  . aspirin EC 325 MG EC tablet Take 1 tablet (325 mg total) by mouth daily.  30 tablet  0  . ranitidine (ZANTAC) 150 MG tablet Take 300 mg by mouth 2 (two) times daily.       No current facility-administered medications for this visit.    No Known Allergies  Family History  Problem Relation Age of Onset  . Heart disease Father     History   Social History  . Marital Status: Single    Spouse Name: N/A    Number of Children: N/A  . Years of Education: N/A   Occupational History  . Not on file.   Social History Main Topics  . Smoking status: Former Smoker -- 0.75 packs/day    Types: Cigarettes    Quit date: 05/21/2013  . Smokeless tobacco: Never Used  . Alcohol Use: No   . Drug Use: Yes    Special: Marijuana  . Sexual Activity: Yes   Other Topics Concern  . Not on file   Social History Narrative  . No narrative on file    ROS ALL NEGATIVE EXCEPT THOSE NOTED IN HPI  PE  General Appearance: well developed, well nourished in no acute distress, obese HEENT: symmetrical face, PERRLA, good dentition  Neck: no JVD, thyromegaly, or adenopathy, trachea midline Chest: symmetric without deformity, mid sternotomy which seems to be healing well with no signs of infection Cardiac: PMI non-displaced, RRR, normal S1, S2, no gallop or murmur Lung: clear to ausculation and percussion Vascular: all pulses full without bruits  Abdominal: nondistended, nontender, good bowel sounds, no HSM, no bruits Extremities: no cyanosis, clubbing , mild pitting edema over the right medial malleolus, no sign of DVT, no varicosities  Skin: normal color, no rashes Neuro: alert and oriented x 3, non-focal Pysch: normal affect  EKG Not repeated  BMET    Component Value Date/Time   NA 140 05/27/2013 0450   K 3.6* 05/27/2013 0450   CL 100 05/27/2013 0450   CO2 27 05/27/2013 0450   GLUCOSE 137* 05/27/2013 0450   BUN 11 05/27/2013 0450   CREATININE 0.98 05/27/2013 0450   CALCIUM 9.1 05/27/2013 0450   GFRNONAA >90 05/27/2013 0450  GFRAA >90 05/27/2013 0450    Lipid Panel  No results found for this basename: chol, trig, hdl, cholhdl, vldl, ldlcalc    CBC    Component Value Date/Time   WBC 14.0* 05/27/2013 0450   RBC 4.48 05/27/2013 0450   HGB 13.5 05/27/2013 0450   HCT 38.8* 05/27/2013 0450   PLT 173 05/27/2013 0450   MCV 86.6 05/27/2013 0450   MCH 30.1 05/27/2013 0450   MCHC 34.8 05/27/2013 0450   RDW 14.0 05/27/2013 0450   LYMPHSABS 2.1 05/21/2013 1025   MONOABS 1.0 05/21/2013 1025   EOSABS 0.2 05/21/2013 1025   BASOSABS 0.0 05/21/2013 1025

## 2013-06-02 NOTE — Assessment & Plan Note (Signed)
Followup with Dr. Laneta Simmers in 2 weeks. I'll see him back in 6 weeks.

## 2013-06-02 NOTE — Assessment & Plan Note (Signed)
Continue pravastatin. Enroll in cardiac rehabilitation and lose weight. Repeat lipids when he follows up with me.

## 2013-06-02 NOTE — Assessment & Plan Note (Signed)
Continue beta blocker and ACE inhibitor. Repeat echocardiogram in 6 months to see if revascularization and medical therapy have improved ejection fraction. Discussed with patient and wife.

## 2013-06-02 NOTE — Progress Notes (Signed)
Pt here f/u with Dr. Daleen Squibb NSTEMI s/p CABG x3 midsternal incision well aprrox,intact,healing scabbed area x 2 suture site, C/o numbness sensation incisional pain and left ankle numbness,swelling Sob with exertion.using IS

## 2013-06-03 ENCOUNTER — Other Ambulatory Visit: Payer: Self-pay | Admitting: *Deleted

## 2013-06-03 ENCOUNTER — Ambulatory Visit (INDEPENDENT_AMBULATORY_CARE_PROVIDER_SITE_OTHER): Payer: Self-pay | Admitting: *Deleted

## 2013-06-03 DIAGNOSIS — R6889 Other general symptoms and signs: Secondary | ICD-10-CM

## 2013-06-03 DIAGNOSIS — I2510 Atherosclerotic heart disease of native coronary artery without angina pectoris: Secondary | ICD-10-CM

## 2013-06-03 DIAGNOSIS — I5022 Chronic systolic (congestive) heart failure: Secondary | ICD-10-CM

## 2013-06-03 DIAGNOSIS — E785 Hyperlipidemia, unspecified: Secondary | ICD-10-CM

## 2013-06-03 DIAGNOSIS — J020 Streptococcal pharyngitis: Secondary | ICD-10-CM

## 2013-06-03 DIAGNOSIS — Z951 Presence of aortocoronary bypass graft: Secondary | ICD-10-CM

## 2013-06-03 MED ORDER — SULFAMETHOXAZOLE-TMP DS 800-160 MG PO TABS: 1.0000 | ORAL_TABLET | Freq: Two times a day (BID) | ORAL | Status: DC

## 2013-06-03 NOTE — Progress Notes (Signed)
Ricardo [name] returns for suture removal of his 3 previous chest tube sites.  These, as well as, his sternal incision and right leg endovein harvest incision are very well healed.  He is having sone serous drainage in the middle of his sternal incision that he said began on Sunday after a small scab fell off.  It only is enough to spot his t-shirt.  I was able to express the drainage, which was a small amount.  There is no signs of infection.  He is also concerned with the swelling of his right ankle and foot  I reassured him that it is minimal at this time.  I did call Dr. Laneta Simmers to discuss the sternal drainage with him.  He ordered an antibiotic and will see him next Thursday in the office. He understands and agrees to call us if the drainage worsens.

## 2013-06-07 ENCOUNTER — Other Ambulatory Visit: Payer: Self-pay | Admitting: *Deleted

## 2013-06-07 DIAGNOSIS — G8918 Other acute postprocedural pain: Secondary | ICD-10-CM

## 2013-06-07 DIAGNOSIS — R600 Localized edema: Secondary | ICD-10-CM

## 2013-06-07 MED ORDER — POTASSIUM CHLORIDE CRYS ER 10 MEQ PO TBCR: 20.0000 meq | EXTENDED_RELEASE_TABLET | Freq: Every day | ORAL | Status: DC

## 2013-06-07 MED ORDER — OXYCODONE HCL 5 MG PO TABS: 5.0000 mg | ORAL_TABLET | ORAL | Status: DC | PRN

## 2013-06-07 MED ORDER — FUROSEMIDE 40 MG PO TABS: 40.0000 mg | ORAL_TABLET | Freq: Every day | ORAL | Status: DC

## 2013-06-08 ENCOUNTER — Other Ambulatory Visit: Payer: Self-pay | Admitting: *Deleted

## 2013-06-08 DIAGNOSIS — I2510 Atherosclerotic heart disease of native coronary artery without angina pectoris: Secondary | ICD-10-CM

## 2013-06-09 ENCOUNTER — Ambulatory Visit (INDEPENDENT_AMBULATORY_CARE_PROVIDER_SITE_OTHER): Payer: No Typology Code available for payment source | Admitting: Surgery

## 2013-06-09 ENCOUNTER — Ambulatory Visit
Admission: RE | Admit: 2013-06-09 | Discharge: 2013-06-09 | Disposition: A | Discharge status: Home / Self Care | Payer: No Typology Code available for payment source | Source: Ambulatory Visit | Attending: Surgery | Admitting: Surgery

## 2013-06-09 ENCOUNTER — Encounter: Payer: Self-pay | Admitting: Surgery

## 2013-06-09 VITALS — BP 121/81 | HR 81 | Resp 16 | Ht 67.0 in | Wt 209.0 lb

## 2013-06-09 DIAGNOSIS — I2510 Atherosclerotic heart disease of native coronary artery without angina pectoris: Secondary | ICD-10-CM

## 2013-06-09 DIAGNOSIS — Z951 Presence of aortocoronary bypass graft: Secondary | ICD-10-CM

## 2013-06-09 NOTE — Progress Notes (Signed)
      HPI:  Patient returns for routine postoperative follow-up having undergone CABG x 4 on 05/25/2013. The patient's early postoperative recovery while in the hospital was notable for an uncomplicated postop course. Since hospital discharge the patient reports that he has been feeling well and walking without chest pain or dyspnea. He was seen in our office by the nurse on 3/12 and noted to have some drainage from the mid point of the chest incision that looked like fat necrosis. He was started on Bactrim DS for a week to prevent cellulitis. He has had no fever or chills. The drainage has stopped. He just got the antibiotic prescription filled yesterday.   Current Outpatient Prescriptions  Medication Sig Dispense Refill  . aspirin EC 325 MG EC tablet Take 1 tablet (325 mg total) by mouth daily.  30 tablet  0  . furosemide (LASIX) 40 MG tablet Take 40 mg by mouth daily. X 7 DAYS  STARTING 06/07/12      . lisinopril (PRINIVIL,ZESTRIL) 10 MG tablet Take 1 tablet (10 mg total) by mouth daily.  30 tablet  1  . metoprolol tartrate (LOPRESSOR) 25 MG tablet Take 1 tablet (25 mg total) by mouth 2 (two) times daily.  60 tablet  1  . oxyCODONE (OXY IR/ROXICODONE) 5 MG immediate release tablet Take 1-2 tablets (5-10 mg total) by mouth every 3 (three) hours as needed for severe pain.  40 tablet  0  . potassium chloride (K-DUR,KLOR-CON) 10 MEQ tablet Take 20 mEq by mouth daily. X 7 DAYS    STARTING 06/07/12      . pravastatin (PRAVACHOL) 10 MG tablet Take 1 tablet (10 mg total) by mouth daily.  30 tablet  1  . ranitidine (ZANTAC) 150 MG tablet Take 300 mg by mouth 2 (two) times daily.      Marland Kitchen sulfamethoxazole-trimethoprim (BACTRIM DS) 800-160 MG per tablet Take 1 tablet by mouth 2 (two) times daily.  14 tablet  0   No current facility-administered medications for this visit.    Physical Exam: BP 121/81  Pulse 81  Resp 16  Ht 5\' 7"  (1.702 m)  Wt 209 lb (94.802 kg)  BMI 32.73 kg/m2  SpO2 99% He looks  well. Lung exam is clear. Cardiac exam shows a regular rate and rhythm with normal heart sounds. Chest incision is healing well and sternum is stable. The leg incisions are healing well and there is trace peripheral edema in the right foot and ankle.   Diagnostic Tests:  CLINICAL DATA: Chest pain  EXAM:  CHEST 2 VIEW  COMPARISON: May 28, 2013  FINDINGS:  There is minimal scarring in the left base. Lungs are otherwise  clear. Heart is upper normal in size with normal pulmonary  vascularity. No adenopathy. Patient is status post coronary artery  bypass grafting. No pneumothorax. No bone lesions.  IMPRESSION:  Minimal scarring left base. Lungs otherwise clear. No pneumothorax.  Electronically Signed  By: Bretta Bang M.D.  On: 06/09/2013 11:29    Impression:  Overall I think he is doing well. I encouraged him to continue walking. He is planning to participate in cardiac rehab. I told him he could drive his car but should not lift anything heavier than 10 lbs for three months postop.   Plan:  He will continue to follow up with Dr. Daleen Squibb and will contact me if he develops any problems with his incisions.

## 2013-06-16 ENCOUNTER — Ambulatory Visit: Payer: Self-pay | Admitting: Surgery

## 2013-06-16 ENCOUNTER — Ambulatory Visit: Payer: Self-pay

## 2013-06-24 ENCOUNTER — Ambulatory Visit: Payer: BC Managed Care – PPO | Attending: Internal Medicine | Admitting: Internal Medicine

## 2013-06-24 ENCOUNTER — Encounter: Payer: Self-pay | Admitting: Internal Medicine

## 2013-06-24 VITALS — BP 104/78 | HR 90 | Temp 98.5°F | Resp 16 | Wt 213.6 lb

## 2013-06-24 DIAGNOSIS — E876 Hypokalemia: Secondary | ICD-10-CM | POA: Diagnosis not present

## 2013-06-24 DIAGNOSIS — I5022 Chronic systolic (congestive) heart failure: Secondary | ICD-10-CM | POA: Insufficient documentation

## 2013-06-24 DIAGNOSIS — Z139 Encounter for screening, unspecified: Secondary | ICD-10-CM

## 2013-06-24 DIAGNOSIS — E785 Hyperlipidemia, unspecified: Secondary | ICD-10-CM | POA: Diagnosis not present

## 2013-06-24 DIAGNOSIS — Z951 Presence of aortocoronary bypass graft: Secondary | ICD-10-CM | POA: Insufficient documentation

## 2013-06-24 DIAGNOSIS — K219 Gastro-esophageal reflux disease without esophagitis: Secondary | ICD-10-CM

## 2013-06-24 LAB — LIPID PANEL
CHOLESTEROL: 110 mg/dL (ref 0–200)
HDL: 31 mg/dL — ABNORMAL LOW (ref >39)
LDL Cholesterol: 62 mg/dL (ref 0–99)
Total CHOL/HDL Ratio: 3.5 Ratio
Triglycerides: 83 mg/dL (ref <150)
VLDL: 17 mg/dL (ref 0–40)

## 2013-06-24 LAB — COMPLETE METABOLIC PANEL WITH GFR
ALT: 38 U/L (ref 0–53)
AST: 22 U/L (ref 0–37)
Albumin: 4.1 g/dL (ref 3.5–5.2)
Alkaline Phosphatase: 85 U/L (ref 39–117)
BUN: 11 mg/dL (ref 6–23)
CALCIUM: 9.4 mg/dL (ref 8.4–10.5)
CO2: 27 meq/L (ref 19–32)
CREATININE: 0.95 mg/dL (ref 0.50–1.35)
Chloride: 101 mEq/L (ref 96–112)
GFR, Est African American: >89 mL/min
GFR, Est Non African American: >89 mL/min
Glucose, Bld: 84 mg/dL (ref 70–99)
Potassium: 4.9 mEq/L (ref 3.5–5.3)
Sodium: 135 mEq/L (ref 135–145)
Total Bilirubin: 0.4 mg/dL (ref 0.2–1.2)
Total Protein: 7.1 g/dL (ref 6.0–8.3)

## 2013-06-24 LAB — TSH: TSH: 0.588 u[IU]/mL (ref 0.350–4.500)

## 2013-06-24 MED ORDER — PRAVASTATIN SODIUM 10 MG PO TABS: 10.0000 mg | ORAL_TABLET | Freq: Every day | ORAL | Status: DC

## 2013-06-24 MED ORDER — METOPROLOL TARTRATE 25 MG PO TABS
25.0000 mg | ORAL_TABLET | Freq: Two times a day (BID) | ORAL | Status: DC
Start: 2013-06-24 — End: 2013-09-23

## 2013-06-24 MED ORDER — LISINOPRIL 10 MG PO TABS: 10.0000 mg | ORAL_TABLET | Freq: Every day | ORAL | Status: DC

## 2013-06-24 NOTE — Progress Notes (Signed)
Patient here to establish care Has history of HTN and high cholesterol

## 2013-06-24 NOTE — Progress Notes (Signed)
Patient Demographics  Ricardo [name], is a 33 y.o. male  RUE:[id number]:[id number]  JYN:[id number]  DOB - [birth year]  CC:  Chief Complaint  Patient presents with  . Establish Care       HPI: Ricardo [name] is a 33 y.o. 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  Patient has No headache, No chest pain, No abdominal pain - No Nausea, No new weakness tingling or numbness, No Cough - SOB.  No Known Allergies Past Medical History  Diagnosis Date  . Migraine   . Hypertension   . Renal disorder    Current Outpatient Prescriptions on File Prior to Visit  Medication Sig Dispense Refill  . aspirin EC 325 MG EC tablet Take 1 tablet (325 mg total) by mouth daily.  30 tablet  0  . furosemide (LASIX) 40 MG tablet Take 40 mg by mouth daily. X 7 DAYS  STARTING 06/07/12      . oxyCODONE (OXY IR/ROXICODONE) 5 MG immediate release tablet Take 1-2 tablets (5-10 mg total) by mouth every 3 (three) hours as needed for severe pain.  40 tablet  0  . potassium chloride (K-DUR,KLOR-CON) 10 MEQ tablet Take 20 mEq by mouth daily. X 7 DAYS    STARTING 06/07/12      . ranitidine (ZANTAC) 150 MG tablet Take 300 mg by mouth 2 (two) times daily.      Marland Kitchen. sulfamethoxazole-trimethoprim (BACTRIM DS) 800-160 MG per tablet Take 1 tablet by mouth 2 (two) times daily.  14 tablet  0   No current facility-administered medications on file prior to visit.   Family History  Problem Relation Age of Onset  . Heart disease Father   . Diabetes Mother     . Heart disease Maternal Grandmother   . Stroke Maternal Grandmother    History   Social History  . Marital Status: Single    Spouse Name: N/A    Number of Children: N/A  . Years of Education: N/A   Occupational History  . Not on file.   Social History Main Topics  . Smoking status: Former Smoker -- 0.75 packs/day    Types: Cigarettes    Quit date: 05/21/2013  . Smokeless tobacco: Never Used  . Alcohol Use: No  . Drug Use: Yes    Special: Marijuana     Comment: last use 3 weeks ago   . Sexual Activity: Yes   Other Topics Concern  . Not on file   Social History Narrative  . No narrative on file    Review of Systems: Constitutional: Negative for fever, chills, diaphoresis, activity change, appetite change and fatigue. HENT: Negative for ear pain, nosebleeds, congestion, facial swelling, rhinorrhea, neck pain, neck stiffness and ear discharge.  Eyes: Negative for pain, discharge, redness, itching and visual disturbance. Respiratory: Negative for cough, choking, chest tightness, shortness of breath, wheezing and stridor.  Cardiovascular: Negative for chest pain, palpitations and leg swelling. Gastrointestinal: Negative for abdominal distention. Genitourinary: Negative for dysuria, urgency, frequency, hematuria, flank pain, decreased urine volume, difficulty urinating and dyspareunia.  Musculoskeletal: Negative for back  pain, joint swelling, arthralgia and gait problem. Neurological: Negative for dizziness, tremors, seizures, syncope, facial asymmetry, speech difficulty, weakness, light-headedness, numbness and headaches.  Hematological: Negative for adenopathy. Does not bruise/bleed easily. Psychiatric/Behavioral: Negative for hallucinations, behavioral problems, confusion, dysphoric mood, decreased concentration and agitation.    Objective:   Filed Vitals:   06/24/13 0933  BP: 104/78  Pulse: 90  Temp: 98.5 F (36.9 C)  Resp: 16    Physical  Exam: Constitutional: Patient appears well-developed and well-nourished. No distress. HENT: Normocephalic, atraumatic, External right and left ear normal. Oropharynx is clear and moist.  Eyes: Conjunctivae and EOM are normal. PERRLA, no scleral icterus. Neck: Normal ROM. Neck supple. No JVD. No tracheal deviation. No thyromegaly. CVS: RRR, S1/S2 +, no murmurs, no gallops, no carotid bruit.  Pulmonary: Effort and breath sounds normal, no stridor, rhonchi, wheezes, rales.  Abdominal: Soft. BS +, no distension, tenderness, rebound or guarding.  Musculoskeletal: Normal range of motion. No edema and no tenderness.  Neuro: Alert. Normal reflexes, muscle tone coordination. No cranial nerve deficit. Skin: Skin on the chest has midline surgical scar healing well nontender. Psychiatric: Normal mood and affect. Behavior, judgment, thought content normal.  Lab Results  Component Value Date   WBC 14.0* 05/27/2013   HGB 13.5 05/27/2013   HCT 38.8* 05/27/2013   MCV 86.6 05/27/2013   PLT 173 05/27/2013   Lab Results  Component Value Date   CREATININE 0.98 05/27/2013   BUN 11 05/27/2013   NA 140 05/27/2013   K 3.6* 05/27/2013   CL 100 05/27/2013   CO2 27 05/27/2013    No results found for this basename: HGBA1C   Lipid Panel  No results found for this basename: chol, trig, hdl, cholhdl, vldl, ldlcalc       Assessment and plan:   1. Hyperlipidemia LDL goal < 100 Will check lipid panel. - pravastatin (PRAVACHOL) 10 MG tablet; Take 1 tablet (10 mg total) by mouth daily.  Dispense: 30 tablet; Refill: 3 - Lipid panel  2. Chronic systolic heart failure/6. S/P CABG x  Continue with aspirin statin beta blocker patient to enroll in cardiac rehabilitation.patient following up with the cardiologist. - lisinopril (PRINIVIL,ZESTRIL) 10 MG tablet; Take 1 tablet (10 mg total) by mouth daily.  Dispense: 30 tablet; Refill: 3 - metoprolol tartrate (LOPRESSOR) 25 MG tablet; Take 1 tablet (25 mg total) by mouth 2 (two) times  daily.  Dispense: 60 tablet; Refill: 3  3. GERD (gastroesophageal reflux disease) Takes OTC Zantac 150 mg twice a day.  4. Hypokalemia Will recheck blood chemistry. - COMPLETE METABOLIC PANEL WITH GFR  5. Screening  - TSH - Vit D  25 hydroxy (rtn osteoporosis monitoring)    Return in about 3 months (around 09/23/2013) for CHF, , hyperipidemia.    Doris Cheadle, MD

## 2013-06-24 NOTE — Patient Instructions (Signed)
DASH Diet  The DASH diet stands for "Dietary Approaches to Stop Hypertension." It is a healthy eating plan that has been shown to reduce high blood pressure (hypertension) in as little as 14 days, while also possibly providing other significant health benefits. These other health benefits include reducing the risk of breast cancer after menopause and reducing the risk of type 2 diabetes, heart disease, colon cancer, and stroke. Health benefits also include weight loss and slowing kidney failure in patients with chronic kidney disease.   DIET GUIDELINES  · Limit salt (sodium). Your diet should contain less than 1500 mg of sodium daily.  · Limit refined or processed carbohydrates. Your diet should include mostly whole grains. Desserts and added sugars should be used sparingly.  · Include small amounts of heart-healthy fats. These types of fats include nuts, oils, and tub margarine. Limit saturated and trans fats. These fats have been shown to be harmful in the body.  CHOOSING FOODS   The following food groups are based on a 2000 calorie diet. See your Registered Dietitian for individual calorie needs.  Grains and Grain Products (6 to 8 servings daily)  · Eat More Often: Whole-wheat bread, brown rice, whole-grain or wheat pasta, quinoa, popcorn without added fat or salt (air popped).  · Eat Less Often: White bread, white pasta, white rice, cornbread.  Vegetables (4 to 5 servings daily)  · Eat More Often: Fresh, frozen, and canned vegetables. Vegetables may be raw, steamed, roasted, or grilled with a minimal amount of fat.  · Eat Less Often/Avoid: Creamed or fried vegetables. Vegetables in a cheese sauce.  Fruit (4 to 5 servings daily)  · Eat More Often: All fresh, canned (in natural juice), or frozen fruits. Dried fruits without added sugar. One hundred percent fruit juice (½ cup [237 mL] daily).  · Eat Less Often: Dried fruits with added sugar. Canned fruit in light or heavy syrup.  Lean Meats, Fish, and Poultry (2  servings or less daily. One serving is 3 to 4 oz [85-114 g]).  · Eat More Often: Ninety percent or leaner ground beef, tenderloin, sirloin. Round cuts of beef, chicken breast, turkey breast. All fish. Grill, bake, or broil your meat. Nothing should be fried.  · Eat Less Often/Avoid: Fatty cuts of meat, turkey, or chicken leg, thigh, or wing. Fried cuts of meat or fish.  Dairy (2 to 3 servings)  · Eat More Often: Low-fat or fat-free milk, low-fat plain or light yogurt, reduced-fat or part-skim cheese.  · Eat Less Often/Avoid: Milk (whole, 2%). Whole milk yogurt. Full-fat cheeses.  Nuts, Seeds, and Legumes (4 to 5 servings per week)  · Eat More Often: All without added salt.  · Eat Less Often/Avoid: Salted nuts and seeds, canned beans with added salt.  Fats and Sweets (limited)  · Eat More Often: Vegetable oils, tub margarines without trans fats, sugar-free gelatin. Mayonnaise and salad dressings.  · Eat Less Often/Avoid: Coconut oils, palm oils, butter, stick margarine, cream, half and half, cookies, candy, pie.  FOR MORE INFORMATION  The Dash Diet Eating Plan: www.dashdiet.org  Document Released: 02/28/2011 Document Revised: 06/03/2011 Document Reviewed: 02/28/2011  [employer]® Patient Information ©2014 [employer].

## 2013-06-25 ENCOUNTER — Telehealth: Payer: Self-pay

## 2013-06-25 LAB — VITAMIN D 25 HYDROXY (VIT D DEFICIENCY, FRACTURES): Vit D, 25-Hydroxy: 21 ng/mL — ABNORMAL LOW (ref 30–89)

## 2013-06-25 MED ORDER — VITAMIN D (ERGOCALCIFEROL) 1.25 MG (50000 UNIT) PO CAPS: 50000.0000 [IU] | ORAL_CAPSULE | ORAL | Status: DC

## 2013-06-25 NOTE — Telephone Encounter (Signed)
Spoke with patient this am  He is aware of his labs Will send prescription to [facility] on [address]

## 2013-06-25 NOTE — Telephone Encounter (Signed)
Message copied by Lestine Mount on Fri Jun 25, 2013 10:38 AM ------      Message from: Doris Cheadle      Created: Fri Jun 25, 2013  9:21 AM       Blood work reviewed, noticed low vitamin D, call patient advise to start ergocalciferol 50,000 units once a week for the duration of  12 weeks.       ------

## 2013-06-30 ENCOUNTER — Ambulatory Visit: Payer: Self-pay | Admitting: Cardiology

## 2013-07-14 ENCOUNTER — Encounter: Payer: Self-pay | Admitting: Cardiology

## 2013-07-14 ENCOUNTER — Ambulatory Visit: Payer: BC Managed Care – PPO | Attending: Cardiology | Admitting: Cardiology

## 2013-07-14 VITALS — BP 130/83 | HR 86 | Temp 98.5°F | Resp 16 | Ht 67.0 in | Wt 224.0 lb

## 2013-07-14 DIAGNOSIS — I509 Heart failure, unspecified: Secondary | ICD-10-CM | POA: Insufficient documentation

## 2013-07-14 DIAGNOSIS — I5022 Chronic systolic (congestive) heart failure: Secondary | ICD-10-CM | POA: Diagnosis not present

## 2013-07-14 DIAGNOSIS — I10 Essential (primary) hypertension: Secondary | ICD-10-CM | POA: Insufficient documentation

## 2013-07-14 DIAGNOSIS — E785 Hyperlipidemia, unspecified: Secondary | ICD-10-CM | POA: Diagnosis not present

## 2013-07-14 DIAGNOSIS — Z951 Presence of aortocoronary bypass graft: Secondary | ICD-10-CM | POA: Diagnosis not present

## 2013-07-14 DIAGNOSIS — I2589 Other forms of chronic ischemic heart disease: Secondary | ICD-10-CM | POA: Insufficient documentation

## 2013-07-14 MED ORDER — FUROSEMIDE 40 MG PO TABS: 40.0000 mg | ORAL_TABLET | Freq: Every day | ORAL | Status: DC

## 2013-07-14 MED ORDER — POTASSIUM CHLORIDE CRYS ER 10 MEQ PO TBCR: 20.0000 meq | EXTENDED_RELEASE_TABLET | Freq: Every day | ORAL | Status: DC

## 2013-07-14 NOTE — Progress Notes (Signed)
HPI Ricardo [name] returns today for his ischemic cardiomyopathy, status post coronary bypass grafting, chronic systolic heart failure.  He has continued to do well. He has some mild chest Ricardo [name] soreness over his sternotomy. He also has some dependent edema in the day and his right leg from his venectomy. He denies any orthopnea, PND or significant dyspnea on exertion. He continues not to smoke. He is frustrated that he's gained weight. We have tried to get in cardiac rehabilitation but there's been a communication problem with him getting back to them. He has been compliant with his meds. LDL is at goal. Last chest x-ray showed no pneumothorax and normal sized heart. There was no pleural effusion. He is anxious to increase his activity.  Past Medical History  Diagnosis Date  . Migraine   . Hypertension   . Renal disorder     Current Outpatient Prescriptions  Medication Sig Dispense Refill  . aspirin EC 325 MG EC tablet Take 1 tablet (325 mg total) by mouth daily.  30 tablet  0  . lisinopril (PRINIVIL,ZESTRIL) 10 MG tablet Take 1 tablet (10 mg total) by mouth daily.  30 tablet  3  . metoprolol tartrate (LOPRESSOR) 25 MG tablet Take 1 tablet (25 mg total) by mouth 2 (two) times daily.  60 tablet  3  . pravastatin (PRAVACHOL) 10 MG tablet Take 1 tablet (10 mg total) by mouth daily.  30 tablet  3  . ranitidine (ZANTAC) 150 MG tablet Take 300 mg by mouth 2 (two) times daily.      . Vitamin D, Ergocalciferol, (DRISDOL) 50000 UNITS CAPS capsule Take 1 capsule (50,000 Units total) by mouth every 7 (seven) days.  12 capsule  0  . furosemide (LASIX) 40 MG tablet Take 40 mg by mouth daily. X 7 DAYS  STARTING 06/07/12      . oxyCODONE (OXY IR/ROXICODONE) 5 MG immediate release tablet Take 1-2 tablets (5-10 mg total) by mouth every 3 (three) hours as needed for severe pain.  40 tablet  0  . potassium chloride (K-DUR,KLOR-CON) 10 MEQ tablet Take 20 mEq by mouth daily. X 7 DAYS    STARTING 06/07/12      .  sulfamethoxazole-trimethoprim (BACTRIM DS) 800-160 MG per tablet Take 1 tablet by mouth 2 (two) times daily.  14 tablet  0   No current facility-administered medications for this visit.    No Known Allergies  Family History  Problem Relation Age of Onset  . Heart disease Father   . Diabetes Mother   . Heart disease Maternal Grandmother   . Stroke Maternal Grandmother     History   Social History  . Marital Status: Single    Spouse Name: N/A    Number of Children: N/A  . Years of Education: N/A   Occupational History  . Not on file.   Social History Main Topics  . Smoking status: Former Smoker -- 0.75 packs/day    Types: Cigarettes    Quit date: 05/21/2013  . Smokeless tobacco: Never Used  . Alcohol Use: No  . Drug Use: Yes    Special: Marijuana     Comment: last use 3 weeks ago   . Sexual Activity: Yes   Other Topics Concern  . Not on file   Social History Narrative  . No narrative on file    ROS ALL NEGATIVE EXCEPT THOSE NOTED IN HPI  PE  General Appearance: well developed, well nourished in no acute distress, muscular, overweight HEENT: symmetrical face,  PERRLA, good dentition  Neck: no JVD, thyromegaly, or adenopathy, trachea midline Chest: symmetric without deformity, sternotomy well healed with no instability by exam Cardiac: PMI non-displaced, RRR, normal S1, S2, no gallop or murmur Lung: clear to ausculation and percussion Vascular: all pulses full without bruits  Abdominal: nondistended, nontender, good bowel sounds, no HSM, no bruits Extremities: no cyanosis, clubbing or edema, no sign of DVT, no varicosities  Skin: normal color, no rashes Neuro: alert and oriented x 3, non-focal Pysch: normal affect  EKG  BMET    Component Value Date/Time   NA 135 06/24/2013 0958   K 4.9 06/24/2013 0958   CL 101 06/24/2013 0958   CO2 27 06/24/2013 0958   GLUCOSE 84 06/24/2013 0958   BUN 11 06/24/2013 0958   CREATININE 0.95 06/24/2013 0958   CREATININE 0.98 05/27/2013  0450   CALCIUM 9.4 06/24/2013 0958   GFRNONAA >89 06/24/2013 0958   GFRNONAA >90 05/27/2013 0450   GFRAA >89 06/24/2013 0958   GFRAA >90 05/27/2013 0450    Lipid Panel     Component Value Date/Time   CHOL 110 06/24/2013 0958   TRIG 83 06/24/2013 0958   HDL 31* 06/24/2013 0958   CHOLHDL 3.5 06/24/2013 0958   VLDL 17 06/24/2013 0958   LDLCALC 62 06/24/2013 0958    CBC    Component Value Date/Time   WBC 14.0* 05/27/2013 0450   RBC 4.48 05/27/2013 0450   HGB 13.5 05/27/2013 0450   HCT 38.8* 05/27/2013 0450   PLT 173 05/27/2013 0450   MCV 86.6 05/27/2013 0450   MCH 30.1 05/27/2013 0450   MCHC 34.8 05/27/2013 0450   RDW 14.0 05/27/2013 0450   LYMPHSABS 2.1 05/21/2013 1025   MONOABS 1.0 05/21/2013 1025   EOSABS 0.2 05/21/2013 1025   BASOSABS 0.0 05/21/2013 1025

## 2013-07-14 NOTE — Progress Notes (Signed)
Pt comes in today for f/u NSTEMI with CABG C/o right foot anterior achy,sor pain +2 edema Denies CP or SOB Pt need cardiac rehab Need to monitor daily weight

## 2013-07-14 NOTE — Assessment & Plan Note (Signed)
I've advised him to get into cardiac rehabilitation he can do it financially. If not he can walk daily starting at 30 minutes and increasing to an hour a day. He can slow down if he starts getting short of breath. He can do isotonic lifting but nothing over his head or across his chest. He needs another 3 months for her sternum to heal completely. I have not released him to play softball. He wants to.

## 2013-07-14 NOTE — Assessment & Plan Note (Signed)
Continue current medical therapy. We will repeat 2-D echocardiogram September 1 to look for improvement in systolic function.

## 2013-07-14 NOTE — Patient Instructions (Signed)
Pt instructed to do Isotonic exercises with 15-20 lb restrictions x 3 mnths Continue taking prescribed Lasix and Potassium Pt need to turn in financial assistance paperwork to Cardiac Rehab Pt will return with Dr. Daleen Squibb in 5 mnths Medication refilled and e-scribed to pharmacy

## 2013-08-09 ENCOUNTER — Emergency Department (HOSPITAL_COMMUNITY)
Admission: EM | Admit: 2013-08-09 | Discharge: 2013-08-09 | Disposition: A | Discharge status: Home / Self Care | Payer: BC Managed Care – PPO | Attending: Emergency Medicine | Admitting: Emergency Medicine

## 2013-08-09 ENCOUNTER — Other Ambulatory Visit: Payer: Self-pay

## 2013-08-09 ENCOUNTER — Emergency Department (HOSPITAL_COMMUNITY): Payer: BC Managed Care – PPO

## 2013-08-09 ENCOUNTER — Encounter (HOSPITAL_COMMUNITY): Payer: Self-pay | Admitting: Emergency Medicine

## 2013-08-09 DIAGNOSIS — Z951 Presence of aortocoronary bypass graft: Secondary | ICD-10-CM

## 2013-08-09 DIAGNOSIS — Z792 Long term (current) use of antibiotics: Secondary | ICD-10-CM | POA: Insufficient documentation

## 2013-08-09 DIAGNOSIS — I5022 Chronic systolic (congestive) heart failure: Secondary | ICD-10-CM

## 2013-08-09 DIAGNOSIS — I2510 Atherosclerotic heart disease of native coronary artery without angina pectoris: Secondary | ICD-10-CM

## 2013-08-09 DIAGNOSIS — I255 Ischemic cardiomyopathy: Secondary | ICD-10-CM

## 2013-08-09 DIAGNOSIS — M25511 Pain in right shoulder: Secondary | ICD-10-CM

## 2013-08-09 DIAGNOSIS — E785 Hyperlipidemia, unspecified: Secondary | ICD-10-CM | POA: Diagnosis present

## 2013-08-09 DIAGNOSIS — R079 Chest pain, unspecified: Secondary | ICD-10-CM

## 2013-08-09 DIAGNOSIS — Z87448 Personal history of other diseases of urinary system: Secondary | ICD-10-CM | POA: Insufficient documentation

## 2013-08-09 DIAGNOSIS — G43909 Migraine, unspecified, not intractable, without status migrainosus: Secondary | ICD-10-CM | POA: Insufficient documentation

## 2013-08-09 DIAGNOSIS — I214 Non-ST elevation (NSTEMI) myocardial infarction: Secondary | ICD-10-CM

## 2013-08-09 DIAGNOSIS — I10 Essential (primary) hypertension: Secondary | ICD-10-CM | POA: Insufficient documentation

## 2013-08-09 DIAGNOSIS — M25519 Pain in unspecified shoulder: Secondary | ICD-10-CM

## 2013-08-09 DIAGNOSIS — Z79899 Other long term (current) drug therapy: Secondary | ICD-10-CM | POA: Insufficient documentation

## 2013-08-09 DIAGNOSIS — Z87891 Personal history of nicotine dependence: Secondary | ICD-10-CM | POA: Insufficient documentation

## 2013-08-09 DIAGNOSIS — I2589 Other forms of chronic ischemic heart disease: Secondary | ICD-10-CM

## 2013-08-09 DIAGNOSIS — Z7982 Long term (current) use of aspirin: Secondary | ICD-10-CM | POA: Insufficient documentation

## 2013-08-09 DIAGNOSIS — I252 Old myocardial infarction: Secondary | ICD-10-CM | POA: Insufficient documentation

## 2013-08-09 HISTORY — DX: Presence of aortocoronary bypass graft: Z95.1

## 2013-08-09 HISTORY — DX: Non-ST elevation (NSTEMI) myocardial infarction: I21.4

## 2013-08-09 HISTORY — DX: Ischemic cardiomyopathy: I25.5

## 2013-08-09 HISTORY — DX: Atherosclerotic heart disease of native coronary artery without angina pectoris: I25.10

## 2013-08-09 LAB — BASIC METABOLIC PANEL
BUN: 16 mg/dL (ref 6–23)
CALCIUM: 9.1 mg/dL (ref 8.4–10.5)
CHLORIDE: 104 meq/L (ref 96–112)
CO2: 23 meq/L (ref 19–32)
Creatinine, Ser: 1.03 mg/dL (ref 0.50–1.35)
GFR calc Af Amer: >90 mL/min (ref >90)
GFR calc non Af Amer: >90 mL/min (ref >90)
Glucose, Bld: 103 mg/dL — ABNORMAL HIGH (ref 70–99)
POTASSIUM: 4.4 meq/L (ref 3.7–5.3)
SODIUM: 140 meq/L (ref 137–147)

## 2013-08-09 LAB — CBC
HEMATOCRIT: 40.9 % (ref 39.0–52.0)
Hemoglobin: 13.9 g/dL (ref 13.0–17.0)
MCH: 28.8 pg (ref 26.0–34.0)
MCHC: 34 g/dL (ref 30.0–36.0)
MCV: 84.9 fL (ref 78.0–100.0)
PLATELETS: 245 10*3/uL (ref 150–400)
RBC: 4.82 MIL/uL (ref 4.22–5.81)
RDW: 14.2 % (ref 11.5–15.5)
WBC: 6.3 10*3/uL (ref 4.0–10.5)

## 2013-08-09 LAB — TROPONIN I: Troponin I: <0.3 ng/mL (ref <0.30)

## 2013-08-09 LAB — PRO B NATRIURETIC PEPTIDE: Pro B Natriuretic peptide (BNP): 473.7 pg/mL — ABNORMAL HIGH (ref 0–125)

## 2013-08-09 LAB — I-STAT TROPONIN, ED: Troponin i, poc: 0.02 ng/mL (ref 0.00–0.08)

## 2013-08-09 MED ORDER — ASPIRIN 325 MG PO TABS
325.0000 mg | ORAL_TABLET | ORAL | Status: AC
  Administered 2013-08-09: 325 mg via ORAL
  Filled 2013-08-09: qty 1

## 2013-08-09 MED ORDER — IBUPROFEN 800 MG PO TABS
800.0000 mg | ORAL_TABLET | Freq: Once | ORAL | Status: AC
  Administered 2013-08-09: 800 mg via ORAL
  Filled 2013-08-09: qty 1

## 2013-08-09 MED ORDER — NITROGLYCERIN 0.4 MG SL SUBL
0.4000 mg | SUBLINGUAL_TABLET | SUBLINGUAL | Status: DC | PRN
  Administered 2013-08-09 (×2): 0.4 mg via SUBLINGUAL
  Filled 2013-08-09: qty 1

## 2013-08-09 NOTE — Discharge Instructions (Signed)
Cardiac Diet A cardiac diet can help stop heart disease or a stroke from happening. It involves eating less unhealthy fats and eating more healthy fats.  FOODS TO AVOID OR LIMIT  Limit saturated fats. This type of fat is found in oils and dairy products, such as:  Coconut oil.  Palm oil.  Cocoa butter.  Butter.  Avoid trans-fat or hydrogenated oils. These are found in fried or pre-made baked goods, such as:  Margarine.  Pre-made cookies, cakes, and crackers.  Limit processed meats (hot dogs, deli meats, sausage) to 3 ounces a week.  Limit high-fat meats (marbled meats, fried chicken, or chicken with skin) to 3 ounces a week.  Limit salt (sodium) to 1500 milligrams a day.   Limit sweets and drinks with added sugar to no more than 5 servings a week. One serving is:  1 tablespoon of sugar.  1 tablespoon of jelly or jam.   cup sorbet.  1 cup lemonade.   cup regular soda. EAT MORE OF THE FOLLOWING FOODS Fruit  Eat 4to 5 servings a day. One serving of fruit is:  1 medium whole fruit.   cup dried fruit.   cup of fresh, frozen, or canned fruit.   cup 100% fruit juice. Vegetables  Eat 4 to 5 servings a day. One serving is:  1 cup raw leafy vegetables.   cup raw or cooked, cut-up vegetables.   cup vegetable juice. Whole Grains  Eat 3 servings a day (1 ounce equals 1 serving). Legumes (such as beans, peas, and lentils)   Eat at least 4 servings a week ( cup equals 1 serving). Nuts and Seeds   Eat at least 4 servings a week ( cup equals 1 serving). Dietary Fiber  Eat 20 to 30 grams a day. Some foods high in dietary fiber include:  Dried beans.  Citrus fruits.  Apples, bananas.  Broccoli, Brussels sprouts, and eggplant.  Oats. Omega-3 Fats  Eat food with omega-3 fats. You can also take a dietary pill (supplement) that has 1 gram of DHA and EPA. Have 3.5 ounces of fatty fish a week, such as:  Salmon.  Mackerel.  [place]  tuna.  Sardines.  Lake trout.  Herring. PREPARING YOUR FOOD  Broil, bake, steam, or roast foods. Do not fry food. Do not cook food in butter (fat).  Use non-stick cooking sprays.  Remove skin from poultry, such as chicken and [place].  Remove fat from meat.  Take the fat off the top of stews, soups, and gravy.  Use lemon or herbs to flavor food instead of using butter or margarine.  Use nonfat yogurt, salsa, or low-fat dressings for salads. Document Released: 09/10/2011 Document Reviewed: 09/10/2011 [facility] Patient Information 2014 [place], [place].  Chest Pain (Nonspecific) It is often hard to give a specific diagnosis for the cause of chest pain. There is always a chance that your pain could be related to something serious, such as a heart attack or a blood clot in the lungs. You need to follow up with your caregiver for further evaluation. CAUSES   Heartburn.  Pneumonia or bronchitis.  Anxiety or stress.  Inflammation around your heart (pericarditis) or lung (pleuritis or pleurisy).  A blood clot in the lung.  A collapsed lung (pneumothorax). It can develop suddenly on its own (spontaneous pneumothorax) or from injury (trauma) to the chest.  Shingles infection (herpes zoster virus). The chest wall is composed of bones, muscles, and cartilage. Any of these can be the source of the  pain.  The bones can be bruised by injury.  The muscles or cartilage can be strained by coughing or overwork.  The cartilage can be affected by inflammation and become sore (costochondritis). DIAGNOSIS  Lab tests or other studies, such as X-rays, electrocardiography, stress testing, or cardiac imaging, may be needed to find the cause of your pain.  TREATMENT   Treatment depends on what may be causing your chest pain. Treatment may include:  Acid blockers for heartburn.  Anti-inflammatory medicine.  Pain medicine for inflammatory conditions.  Antibiotics if an infection is  present.  You may be advised to change lifestyle habits. This includes stopping smoking and avoiding alcohol, caffeine, and chocolate.  You may be advised to keep your head raised (elevated) when sleeping. This reduces the chance of acid going backward from your stomach into your esophagus.  Most of the time, nonspecific chest pain will improve within 2 to 3 days with rest and mild pain medicine. HOME CARE INSTRUCTIONS   If antibiotics were prescribed, take your antibiotics as directed. Finish them even if you start to feel better.  For the next few days, avoid physical activities that bring on chest pain. Continue physical activities as directed.  Do not smoke.  Avoid drinking alcohol.  Only take over-the-counter or prescription medicine for pain, discomfort, or fever as directed by your caregiver.  Follow your caregiver's suggestions for further testing if your chest pain does not go away.  Keep any follow-up appointments you made. If you do not go to an appointment, you could develop lasting (chronic) problems with pain. If there is any problem keeping an appointment, you must call to reschedule. SEEK MEDICAL CARE IF:   You think you are having problems from the medicine you are taking. Read your medicine instructions carefully.  Your chest pain does not go away, even after treatment.  You develop a rash with blisters on your chest. SEEK IMMEDIATE MEDICAL CARE IF:   You have increased chest pain or pain that spreads to your arm, neck, jaw, back, or abdomen.  You develop shortness of breath, an increasing cough, or you are coughing up blood.  You have severe back or abdominal pain, feel nauseous, or vomit.  You develop severe weakness, fainting, or chills.  You have a fever. THIS IS AN EMERGENCY. Do not wait to see if the pain will go away. Get medical help at once. Call your local emergency services (911 in [place] Do not drive yourself to the hospital. MAKE SURE YOU:    Understand these instructions.  Will watch your condition.  Will get help right away if you are not doing well or get worse. Document Released: 12/19/2004 Document Revised: 06/03/2011 Document Reviewed: 10/15/2007 [facility] Patient Information 2014 [place], [place].  Chest Wall Pain Chest wall pain is pain in or around the bones and muscles of your chest. It may take up to 6 weeks to get better. It may take longer if you must stay physically active in your work and activities.  CAUSES  Chest wall pain may happen on its own. However, it may be caused by:  A viral illness like the flu.  Injury.  Coughing.  Exercise.  Arthritis.  Fibromyalgia.  Shingles. HOME CARE INSTRUCTIONS   Avoid overtiring physical activity. Try not to strain or perform activities that cause pain. This includes any activities using your chest or your abdominal and side muscles, especially if heavy weights are used.  Put ice on the sore area.  Put ice in  a plastic bag.  Place a towel between your skin and the bag.  Leave the ice on for 15-20 minutes per hour while awake for the first 2 days.  Only take over-the-counter or prescription medicines for pain, discomfort, or fever as directed by your caregiver. SEEK IMMEDIATE MEDICAL CARE IF:   Your pain increases, or you are very uncomfortable.  You have a fever.  Your chest pain becomes worse.  You have new, unexplained symptoms.  You have nausea or vomiting.  You feel sweaty or lightheaded.  You have a cough with phlegm (sputum), or you cough up blood. MAKE SURE YOU:   Understand these instructions.  Will watch your condition.  Will get help right away if you are not doing well or get worse. Document Released: 03/11/2005 Document Revised: 06/03/2011 Document Reviewed: 11/05/2010 [facility] Patient Information 2014 [place], [place].

## 2013-08-09 NOTE — ED Provider Notes (Signed)
CSN: [id number]     Arrival date & time 08/09/13  0865 History   First MD Initiated Contact with Patient 08/09/13 [phone]     Chief Complaint  Patient presents with  . Chest Pain     (Consider location/radiation/quality/duration/timing/severity/associated sxs/prior Treatment) HPI Comments: 33 y/o male with a PMHx of CAD s/p CABG 05/25/13, renal d/o, HTN and migraines presents to the ED complaining of sudden onset right upper chest pain x1 day radiating towards his back beginning yesterday while he was driving. Pain described as a muscle cramp, worse when he moves his right arm. Denies nausea, vomiting, diaphoresis, shortness of breath, fever or chills. He tried taking Tylenol with no relief, last dose of 45 minutes prior to arrival. Three months ago he had an MI and underwent quadruple bypass, and states this does not feel similar to when he had the MI, at that time he had numbness to his right pinky finger of his right arm. Currently denies any numbness, weakness or tingling. He last saw cardiothoracic surgery one month ago and everything was fine.  Patient is a 33 y.o. male presenting with chest pain. The history is provided by the patient and medical records.  Chest Pain   Past Medical History  Diagnosis Date  . Migraine   . Hypertension   . Renal disorder   . NSTEMI (non-ST elevated myocardial infarction) 05/21/2013  . CAD, multiple vessel 05/21/2013    RCA & LCx 100%, 90% large RI, 50-70% segmental Prox LAD, 90% D1; EF 35-40% (basal-mid Inf Akinesis & severe HK of mid-anterolateral wall on Echo)  . 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 Ischemic cardiomyopathy 05/21/2013   Past Surgical History  Procedure Laterality Date  . Coronary artery bypass graft N/A 05/25/2013    Procedure: CORONARY ARTERY BYPASS GRAFTING (CABG);  Surgeon: Alleen Borne, MD;  Location: [facility] OR;  Service: Open Heart Surgery;  Laterality: N/A;  . Intraoperative transesophageal echocardiogram N/A  05/25/2013    Procedure: INTRAOPERATIVE TRANSESOPHAGEAL ECHOCARDIOGRAM;  Surgeon: Alleen Borne, MD;  Location: [facility] OR;  Service: Open Heart Surgery;  Laterality: N/A;  . Endovein harvest of greater saphenous vein Right 05/25/2013    Procedure: ENDOVEIN HARVEST OF GREATER SAPHENOUS VEIN;  Surgeon: Alleen Borne, MD;  Location: [facility] OR;  Service: Open Heart Surgery;  Laterality: Right;  . Cardiac catheterization  05/21/2013    RCA & LCx 100%, 90% large RI, 50-70% segmental Prox LAD, 90% D1   Family History  Problem Relation Age of Onset  . Heart disease Father   . Diabetes Mother   . Heart disease Maternal Grandmother   . Stroke Maternal Grandmother    History  Substance Use Topics  . Smoking status: Former Smoker -- 0.75 packs/day    Types: Cigarettes    Quit date: 05/21/2013  . Smokeless tobacco: Never Used  . Alcohol Use: No    Review of Systems  Cardiovascular: Positive for chest pain.  All other systems reviewed and are negative.     Allergies  Review of patient's allergies indicates no known allergies.  Home Medications   Prior to Admission medications   Medication Sig Start Date End Date Taking? Authorizing Provider  aspirin EC 325 MG EC tablet Take 1 tablet (325 mg total) by mouth daily. 05/29/13   Wilmon Pali, PA-C  furosemide (LASIX) 40 MG tablet Take 1 tablet (40 mg total) by mouth daily. X 7 DAYS  STARTING 06/07/12 07/14/13  Gaylord Shihhomas C Wall, MD  lisinopril (PRINIVIL,ZESTRIL) 10 MG tablet Take 1 tablet (10 mg total) by mouth daily. 06/24/13   Doris Cheadleeepak Advani, MD  metoprolol tartrate (LOPRESSOR) 25 MG tablet Take 1 tablet (25 mg total) by mouth 2 (two) times daily. 06/24/13   Doris Cheadleeepak Advani, MD  oxyCODONE (OXY IR/ROXICODONE) 5 MG immediate release tablet Take 1-2 tablets (5-10 mg total) by mouth every 3 (three) hours as needed for severe pain. 06/07/13   Purcell Nailslarence H Owen, MD  potassium chloride (K-DUR,KLOR-CON) 10 MEQ tablet Take 2 tablets (20 mEq total) by mouth daily. X 7 DAYS     STARTING 06/07/12 07/14/13   Gaylord Shihhomas C Wall, MD  pravastatin (PRAVACHOL) 10 MG tablet Take 1 tablet (10 mg total) by mouth daily. 06/24/13   Doris Cheadleeepak Advani, MD  ranitidine (ZANTAC) 150 MG tablet Take 300 mg by mouth 2 (two) times daily.    Historical Provider, MD  sulfamethoxazole-trimethoprim (BACTRIM DS) 800-160 MG per tablet Take 1 tablet by mouth 2 (two) times daily. 06/03/13   Alleen BorneBryan K Bartle, MD  Vitamin D, Ergocalciferol, (DRISDOL) 50000 UNITS CAPS capsule Take 1 capsule (50,000 Units total) by mouth every 7 (seven) days. 06/25/13   Doris Cheadleeepak Advani, MD   BP 117/76  Pulse 62  Temp(Src) 98 F (36.7 C) (Oral)  Resp 20  Ht 5\' 7"  (1.702 m)  Wt 220 lb (99.791 kg)  BMI 34.45 kg/m2  SpO2 98% Physical Exam  Nursing note and vitals reviewed. Constitutional: He is oriented to person, place, and time. He appears well-developed and well-nourished. No distress.  HENT:  Head: Normocephalic and atraumatic.  Mouth/Throat: Oropharynx is clear and moist.  Eyes: Conjunctivae and EOM are normal. Pupils are equal, round, and reactive to light.  Neck: Normal range of motion. Neck supple. No JVD present.  Cardiovascular: Normal rate, regular rhythm, normal heart sounds and intact distal pulses.   No extremity edema.  Pulmonary/Chest: Effort normal and breath sounds normal. No respiratory distress. He exhibits tenderness.    Mid-sternal surgical scar.  Abdominal: Soft. Bowel sounds are normal. There is no tenderness.  Musculoskeletal: Normal range of motion. He exhibits no edema.  Neurological: He is alert and oriented to person, place, and time. He has normal strength. No sensory deficit.  Speech fluent, goal oriented. Moves limbs without ataxia. Equal grip strength bilateral.  Skin: Skin is warm and dry. He is not diaphoretic.  Psychiatric: He has a normal mood and affect. His behavior is normal.    ED Course  Procedures (including critical care time) Labs Review Labs Reviewed  PRO B NATRIURETIC  PEPTIDE - Abnormal; Notable for the following:    Pro B Natriuretic peptide (BNP) 473.7 (*)    All other components within normal limits  BASIC METABOLIC PANEL - Abnormal; Notable for the following:    Glucose, Bld 103 (*)    All other components within normal limits  CBC  TROPONIN I  Rosezena SensorI-STAT TROPOININ, ED    Imaging Review Dg Chest 2 View  08/09/2013   CLINICAL DATA:  Chest pain.  EXAM: CHEST  2 VIEW  COMPARISON:  Chest x-ray 06/09/2013.  FINDINGS: Lung volumes are normal. No consolidative airspace disease. No pleural effusions. No evidence pulmonary edema. Heart size appears borderline enlarged. Upper mediastinal contours are within normal limits. Status post median sternotomy for CABG.  IMPRESSION: 1. No radiographic evidence of acute cardiopulmonary disease.   Electronically Signed   By: Trudie Reedaniel  Entrikin M.D.   On: 08/09/2013 06:48     EKG  Interpretation   Date/Time:  Monday Aug 09 2013 06:14:43 EDT Ventricular Rate:  73 PR Interval:  188 QRS Duration: 90 QT Interval:  408 QTC Calculation: 449 R Axis:   -76 Text Interpretation:  Sinus rhythm with occasional Premature ventricular  complexes Left anterior fascicular block Possible Inferior infarct , age  undetermined Abnormal ECG No significant difference from prior ecg  Confirmed by DELOS  MD, DOUGLAS ([id number]) on 08/09/2013 7:29:07 AM      MDM   Final diagnoses:  Chest pain   Patient presenting with chest pain, recent CABG. He is well appearing and in no apparent distress. Vital signs stable. EKG showing sinus rhythm with occasional premature ventricular complexes, left anterior fascicular block. Labs pending. Plan to consult cardiology. 11:29 AM Workup negative. Patient evaluated by cardiologist Dr. Herbie Baltimore who does not feel patient's chest pain is cardiac in nature at this time, however musculoskeletal as it is reproducible despite pain improving after nitroglycerin. Dr. Herbie Baltimore feels the patient can be discharged home.  After pressing on his chest, pain returned. Ibuprofen given. Second set of cardiac enzymes obtained, troponin negative. Patient is stable for discharge with followup with his cardiologist. Return precautions given. Patient states understanding of treatment care plan and is agreeable.  Trevor Mace, PA-C 08/09/13 1131

## 2013-08-09 NOTE — ED Notes (Signed)
Cardiology at bedside.

## 2013-08-09 NOTE — ED Provider Notes (Signed)
Medical screening examination/treatment/procedure(s) were performed by non-physician practitioner and as supervising physician I was immediately available for consultation/collaboration.   EKG Interpretation   Date/Time:  Monday Aug 09 2013 06:14:43 EDT Ventricular Rate:  73 PR Interval:  188 QRS Duration: 90 QT Interval:  408 QTC Calculation: 449 R Axis:   -76 Text Interpretation:  Sinus rhythm with occasional Premature ventricular  complexes Left anterior fascicular block Possible Inferior infarct , age  undetermined Abnormal ECG No significant difference from prior ecg  Confirmed by DELOS  MD, Nessa Ramaker ([id number]) on 08/09/2013 7:29:07 AM       Geoffery Lyons, MD 08/09/13 1318

## 2013-08-09 NOTE — ED Notes (Signed)
D/c I/v 

## 2013-08-09 NOTE — Consult Note (Addendum)
CARDIOLOGY CONSULTATION NOTE.  NAME:  Ricardo [name]   MRN: [id number] DOB:  February 24, 1981   ADMIT DATE: 08/09/2013  Reason for Consult: R Chest & Arm pain -- ~2 months post CABG  [name], [name], [name] Primary Cardiologist: Valera CastleWall, Thomas  HPI: This is a 33 y.o. male with a past medical history significant for recently diagnosed multivessel CAD in the setting of non-STEMI who was referred for 4 vessel CABG after his initial catheterization. He now has ischemic cardiomyopathy with probably class 1-2 chronic CHF symptoms. He is otherwise recovering well from his CABG, and gradually getting back into routine activities. His presenting symptom for non-STEMI was right arm and finger numbness and aching. No chest discomfort. He presented emergency room today with a complaint of pain really is on this left upper back into the neck radiating up to the front of his chest. He took a couple, but did not go away. Because the symptom was nondistended was also right arm related with right arm numbness, he was concerned and therefore came to the emergency room for evaluation. The pain started last night and he had trouble sleeping. He is noted feeling very uncomfortable and having difficulty getting. He denied any dyspnea and nausea. The symptom was not exacerbated by getting up walking around. In fact walking made it feel better.   Prior to this presentation he is quite well for cardiac standpoint. He has not had any untoward cardiac symptoms of either angina were CHF. He does have swelling of the right leg with occasional numbness, and expected sternal wound aching and numbness. He also notes a dry cough since his surgery. He has not smoked since his admission  In the emergency room, he continues to feel somewhat uncomfortable. He denies any other symptoms. He has had several PVCs and short runs of AIVR which have been totally a symptomatic.  No chest pain or  shortness of breath with rest or exertion --he does note exertional dyspnea only with significant exertion since he has not been doing much.. No PND, orthopnea or with only right leg edema.  No palpitations, lightheadedness, dizziness, weakness or syncope/near syncope. No TIA/amaurosis fugax symptoms. No melena, hematochezia, hematuria, or epstaxis. No claudication.  PMHx:  CARDIAC HISTORY: cardiac catheterization and Referred for CABG Echo:  05/22/2013: Decreased contractility - EF 35-40%; severe hypokinesis of mid anterolateral wall and akinesis of the basal-mid inferior wall.;; Intra-Op TEE 05/25/2013: EF 40-50% with mild concentric LVH  Past Medical History  Diagnosis Date  . Migraine   . Hypertension   . Renal disorder   . NSTEMI (non-ST elevated myocardial infarction) 05/21/2013  . CAD, multiple vessel 05/21/2013    RCA & LCx 100%, 90% large RI, 50-70% segmental Prox LAD, 90% D1; EF 35-40% (basal-mid Inf Akinesis & severe HK of mid-anterolateral wall on Echo)  . 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 Ischemic cardiomyopathy 05/21/2013   Past Surgical History  Procedure Laterality Date  . Coronary artery bypass graft N/A 05/25/2013    Procedure: CORONARY ARTERY BYPASS GRAFTING (CABG);  Surgeon: Alleen BorneBryan K Bartle, MD;  [facility];  Service: Open Heart Surgery;  Laterality: N/A;  . Intraoperative transesophageal echocardiogram N/A 05/25/2013    Procedure: INTRAOPERATIVE TRANSESOPHAGEAL ECHOCARDIOGRAM;  Surgeon: Alleen BorneBryan K Bartle, MD;  [facility];  Service: Open Heart Surgery;  Laterality: N/A;  . Endovein harvest of greater saphenous vein Right 05/25/2013    Procedure: ENDOVEIN HARVEST  OF GREATER SAPHENOUS VEIN;  Surgeon: Alleen Borne, MD;  Location: [facility] OR;  Service: Open Heart Surgery;  Laterality: Right;  . Cardiac catheterization  05/21/2013    RCA & LCx 100%, 90% large RI, 50-70% segmental Prox LAD, 90% D1    FAMHx: Family History  Problem Relation Age of  Onset  . Heart disease Father   . Diabetes Mother   . Heart disease Maternal Grandmother   . Stroke Maternal Grandmother    SOCHx:  reports that he quit smoking about 2 months ago. His smoking use included Cigarettes. He smoked 0.75 packs per day. He has never used smokeless tobacco. He reports that he uses illicit drugs (Marijuana). He reports that he does not drink alcohol.  ALLERGIES: No Known Allergies  ROS: Minimal palpitations with stable baseline dyspnea with significant exertion. A comprehensive review of systems was negative except for: Constitutional: positive for Generally feeling tired because of poor sleep Ears, nose, mouth, throat, and face: positive for He  has a slight tickle in his throat that causes him to cough Respiratory: positive for cough and Nonproductive Musculoskeletal: positive for Pain is described above. Also along the right leg down to the foot from the graft excision site distally he occasionally notes numbness.  HOME MEDICATIONS: No current facility-administered medications on file prior to encounter.   Current Outpatient Prescriptions on File Prior to Encounter  Medication Sig Dispense Refill  . aspirin EC 325 MG EC tablet Take 1 tablet (325 mg total) by mouth daily.  30 tablet  0  . furosemide (LASIX) 40 MG tablet Take 1 tablet (40 mg total) by mouth daily. X 7 DAYS  STARTING 06/07/12  30 tablet  2  . lisinopril (PRINIVIL,ZESTRIL) 10 MG tablet Take 1 tablet (10 mg total) by mouth daily.  30 tablet  3  . metoprolol tartrate (LOPRESSOR) 25 MG tablet Take 1 tablet (25 mg total) by mouth 2 (two) times daily.  60 tablet  3  . potassium chloride (K-DUR,KLOR-CON) 10 MEQ tablet Take 2 tablets (20 mEq total) by mouth daily. X 7 DAYS    STARTING 06/07/12  30 tablet  3  . pravastatin (PRAVACHOL) 10 MG tablet Take 1 tablet (10 mg total) by mouth daily.  30 tablet  3  . Vitamin D, Ergocalciferol, (DRISDOL) 50000 UNITS CAPS capsule Take 1 capsule (50,000 Units total)  by mouth every 7 (seven) days.  12 capsule  0     VITALS: Blood pressure 113/59, pulse 67, temperature 97.7 F (36.5 C), temperature source Oral, resp. rate 22, height 5\' 7"  (1.702 m), weight 220 lb (99.791 kg), SpO2 100.00%. PHYSICAL EXAM: General appearance: alert, cooperative, appears stated age, no distress and W. overweight but relatively healthy-appearing. Answers questions appropriately. Admittedly it is anxious since his MI and CABG Neck: no adenopathy, no carotid bruit, no JVD and supple, symmetrical, trachea midline Lungs: clear to auscultation bilaterally, normal percussion bilaterally and Nonlabored, good air movement Heart: Mostly RRR with normal S1 and S2. Intermittent ectopy noted. Nondisplaced PMI. No M./R./G. noted. Abdomen: Soft with mild tenderness along his incision sites but otherwise nondistended. NABS. No HSM. Extremities: He has trace edema in the right ankle only. Otherwise no clubbing or cyanosis. Pulses: 2+ and symmetric Skin: Multiple tattoos. His sternotomy scar has mild keloid scarring as did his abdominal surgical wounds Neurologic: Mental status: Alert, oriented, thought content appropriate Cranial nerves: normal Motor: grossly normal; no change in discomfort along the right shoulder with range of motion movements or extension/  flexion of the shoulder. There is point tenderness along the trapezius just lateral to the spinal column  LABS: Results for orders placed during the hospital encounter of 08/09/13 (from the past 24 hour(s))  CBC     Status: None   Collection Time    08/09/13  6:55 AM      Result Value Ref Range   WBC 6.3  4.0 - 10.5 K/uL   RBC 4.82  4.22 - 5.81 MIL/uL   Hemoglobin 13.9  13.0 - 17.0 g/dL   HCT 16.1  09.6 - 04.5 %   MCV 84.9  78.0 - 100.0 fL   MCH 28.8  26.0 - 34.0 pg   MCHC 34.0  30.0 - 36.0 g/dL   RDW 40.9  81.1 - 91.4 %   Platelets 245  150 - 400 K/uL  PRO B NATRIURETIC PEPTIDE     Status: Abnormal   Collection Time     08/09/13  6:55 AM      Result Value Ref Range   Pro B Natriuretic peptide (BNP) 473.7 (*) 0 - 125 pg/mL  BASIC METABOLIC PANEL     Status: Abnormal   Collection Time    08/09/13  6:55 AM      Result Value Ref Range   Sodium 140  137 - 147 mEq/L   Potassium 4.4  3.7 - 5.3 mEq/L   Chloride 104  96 - 112 mEq/L   CO2 23  19 - 32 mEq/L   Glucose, Bld 103 (*) 70 - 99 mg/dL   BUN 16  6 - 23 mg/dL   Creatinine, Ser 7.82  0.50 - 1.35 mg/dL   Calcium 9.1  8.4 - 95.6 mg/dL   GFR calc non Af Amer >90  >90 mL/min   GFR calc Af Amer >90  >90 mL/min  I-STAT TROPOININ, ED     Status: None   Collection Time    08/09/13  7:02 AM      Result Value Ref Range   Troponin i, poc 0.02  0.00 - 0.08 ng/mL   Comment 3            EKG: NSR with PVC, 73; LAFB, Lat TWI - CRO ischemia; Possible Inf MI - old TELEMETRY: Mostly NSR with PVCs; one Triplet; short sustained runs of AIVR (p waves noted in QRS).  IMAGING: No results found.  IMPRESSION: Principal Problem:   Pain of right shoulder region - mostly in the medial aspect of the right trapezius muscle radiating anteriorly. Active Problems:   NSTEMI (non-ST elevated myocardial infarction) - recent;    S/P CABG x 3   Chronic systolic heart failure   Ischemic cardiomyopathy   Hyperlipidemia LDL goal < 100   RECOMMENDATION: The patient certainly has concerning features and history for his early presentation of CAD and CABG. It is my impression that his current symptoms mostly muscle skeletal related as it is reproducible on exam. He notes some improvement with nitroglycerin that may have little more to do with his slightly elevated BNP. I think provided his second her cardiac markers are negative. He should be fine to go home with standard instructions for treatment of muscle skeletal pain. I would avoid NSAIDs such as possible. Use heating pads and Tylenol  He did have some PVCs with a AIVR with at least one rapid triplet of PVCs on telemetry. He is on a  beta blocker and a total of history of ischemic cardiomyopathy with EF somewhere between 35-45%. There was  significant regional wall motion abnormalities suggesting likely inferior scar. Our review these strips with our electrophysiologists to ensure that no additional evaluation is required --> would recheck echo as OP to ensure that his EF remains stable to improved.  Continue BB dose.  If AIVR is frequent (he does note the expected shortness of breath sensation) would consider Class Ia antiarrhythmic (per Dr. Graciela Husbands in curbside conversation, but would defer definitive decision to EP consult)  He should be fine for d/c from ER -- would move up f/u appt  with Dr. Daleen Squibb. Consider converting from ACE-I to ARB for dry cough.  Time Spent Directly with Patient: 25 minutes with the patient an additional 15 minutes in charting  Marykay Lex, M.D., M.S. Interventional Cardiologist   Pager # [phone] 08/09/2013

## 2013-08-09 NOTE — ED Notes (Signed)
Pt. reports right upper chest pain onset yesterday with slight SOB . Denies nausea or diaphoresis , pt. stated history of CAD / CABG.

## 2013-08-09 NOTE — ED Notes (Signed)
Pt had a change where he flipped his QRS and it appeared wider, he had multifocal PVCs and 2 beat run of VTach.  Notified Robin PA and she showed strips to EDP.  Will continue to monitor.  Placed patient on 02/2L and no distress.

## 2013-09-23 ENCOUNTER — Ambulatory Visit: Payer: BC Managed Care – PPO | Attending: Internal Medicine | Admitting: Internal Medicine

## 2013-09-23 ENCOUNTER — Encounter: Payer: Self-pay | Admitting: Internal Medicine

## 2013-09-23 VITALS — BP 131/85 | HR 63 | Temp 98.3°F | Resp 16 | Wt 229.8 lb

## 2013-09-23 DIAGNOSIS — I5022 Chronic systolic (congestive) heart failure: Secondary | ICD-10-CM | POA: Insufficient documentation

## 2013-09-23 DIAGNOSIS — E876 Hypokalemia: Secondary | ICD-10-CM | POA: Insufficient documentation

## 2013-09-23 DIAGNOSIS — Z760 Encounter for issue of repeat prescription: Secondary | ICD-10-CM | POA: Insufficient documentation

## 2013-09-23 DIAGNOSIS — E785 Hyperlipidemia, unspecified: Secondary | ICD-10-CM | POA: Insufficient documentation

## 2013-09-23 DIAGNOSIS — K219 Gastro-esophageal reflux disease without esophagitis: Secondary | ICD-10-CM | POA: Insufficient documentation

## 2013-09-23 DIAGNOSIS — I10 Essential (primary) hypertension: Secondary | ICD-10-CM | POA: Insufficient documentation

## 2013-09-23 DIAGNOSIS — E559 Vitamin D deficiency, unspecified: Secondary | ICD-10-CM | POA: Insufficient documentation

## 2013-09-23 MED ORDER — LISINOPRIL 10 MG PO TABS: 10.0000 mg | ORAL_TABLET | Freq: Every day | ORAL | Status: DC

## 2013-09-23 MED ORDER — POTASSIUM CHLORIDE CRYS ER 10 MEQ PO TBCR: 20.0000 meq | EXTENDED_RELEASE_TABLET | Freq: Every day | ORAL | Status: DC

## 2013-09-23 MED ORDER — FUROSEMIDE 40 MG PO TABS: 40.0000 mg | ORAL_TABLET | Freq: Every day | ORAL | Status: DC

## 2013-09-23 MED ORDER — METOPROLOL TARTRATE 25 MG PO TABS: 25.0000 mg | ORAL_TABLET | Freq: Two times a day (BID) | ORAL | Status: DC

## 2013-09-23 MED ORDER — PRAVASTATIN SODIUM 10 MG PO TABS: 10.0000 mg | ORAL_TABLET | Freq: Every day | ORAL | Status: DC

## 2013-09-23 NOTE — Progress Notes (Signed)
MRN: [id number] Name: Ricardo [name]  Sex: male Age: 33 y.o. DOB: 1981-01-02  Allergies: Review of patient's allergies indicates no known allergies.  Chief Complaint  Patient presents with  . Follow-up    HPI: Patient is 33 y.o. male who has history of hypertension CHF GERD hyperlipidemia comes today for followup, patient requesting refill on his medications, his blood pressure is well controlled denies any chest pain or shortness of breath, he had blood work done which was reviewed with the patient noticed vitamin D deficiency as per patient he finished a course of prescription dose, I have advised patient to start taking 2000 units over the counter daily.  Past Medical History  Diagnosis Date  . Migraine   . Hypertension   . Renal disorder   . NSTEMI (non-ST elevated myocardial infarction) 05/21/2013  . CAD, multiple vessel 05/21/2013    RCA & LCx 100%, 90% large RI, 50-70% segmental Prox LAD, 90% D1; EF 35-40% (basal-mid Inf Akinesis & severe HK of mid-anterolateral wall on Echo)  . 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 Ischemic cardiomyopathy 05/21/2013    Past Surgical History  Procedure Laterality Date  . Coronary artery bypass graft N/A 05/25/2013    Procedure: CORONARY ARTERY BYPASS GRAFTING (CABG);  Surgeon: Alleen Borne, MD;  Location[facility];  Service: Open Heart Surgery;  Laterality: N/A;  . Intraoperative transesophageal echocardiogram N/A 05/25/2013    Procedure: INTRAOPERATIVE TRANSESOPHAGEAL ECHOCARDIOGRAM;  Surgeon: Alleen Borne, MD;  Location: [facility] OR;  Service: Open Heart Surgery;  Laterality: N/A;  . Endovein harvest of greater saphenous vein Right 05/25/2013    Procedure: ENDOVEIN HARVEST OF GREATER SAPHENOUS VEIN;  Surgeon: Alleen Borne, MD;  Location: [facility] OR;  Service: Open Heart Surgery;  Laterality: Right;  . Cardiac catheterization  05/21/2013    RCA & LCx 100%, 90% large RI, 50-70% segmental Prox LAD, 90% D1      Medication List       This list is accurate as of: 09/23/13  9:28 AM.  Always use your most recent med list.               acetaminophen 500 MG tablet  Commonly known as:  TYLENOL  Take 1,000 mg by mouth every 6 (six) hours as needed for mild pain.     aspirin 325 MG EC tablet  Take 1 tablet (325 mg total) by mouth daily.     furosemide 40 MG tablet  Commonly known as:  LASIX  Take 1 tablet (40 mg total) by mouth daily. X 7 DAYS  STARTING 06/07/12     lisinopril 10 MG tablet  Commonly known as:  PRINIVIL,ZESTRIL  Take 1 tablet (10 mg total) by mouth daily.     metoprolol tartrate 25 MG tablet  Commonly known as:  LOPRESSOR  Take 1 tablet (25 mg total) by mouth 2 (two) times daily.     potassium chloride 10 MEQ tablet  Commonly known as:  K-DUR,KLOR-CON  Take 2 tablets (20 mEq total) by mouth daily. X 7 DAYS    STARTING 06/07/12     pravastatin 10 MG tablet  Commonly known as:  PRAVACHOL  Take 1 tablet (10 mg total) by mouth daily.     Vitamin D (Ergocalciferol) 50000 UNITS Caps capsule  Commonly known as:  DRISDOL  Take 1 capsule (50,000 Units total) by mouth every 7 (seven) days.        Meds ordered  this encounter  Medications  . furosemide (LASIX) 40 MG tablet    Sig: Take 1 tablet (40 mg total) by mouth daily. X 7 DAYS  STARTING 06/07/12    Dispense:  30 tablet    Refill:  2  . lisinopril (PRINIVIL,ZESTRIL) 10 MG tablet    Sig: Take 1 tablet (10 mg total) by mouth daily.    Dispense:  30 tablet    Refill:  3    Order Specific Question:  Supervising Provider    Answer:  Laneta Simmers, BRYAN K [[id number]]  . metoprolol tartrate (LOPRESSOR) 25 MG tablet    Sig: Take 1 tablet (25 mg total) by mouth 2 (two) times daily.    Dispense:  60 tablet    Refill:  3    Order Specific Question:  Supervising Provider    Answer:  Laneta Simmers, BRYAN K [[id number]]  . potassium chloride (K-DUR,KLOR-CON) 10 MEQ tablet    Sig: Take 2 tablets (20 mEq total) by mouth daily. X 7 DAYS    STARTING 06/07/12    Dispense:  30  tablet    Refill:  3  . pravastatin (PRAVACHOL) 10 MG tablet    Sig: Take 1 tablet (10 mg total) by mouth daily.    Dispense:  30 tablet    Refill:  3    Order Specific Question:  Supervising Provider    Answer:  Evelene Croon K [[id number]]     There is no immunization history on file for this patient.  Family History  Problem Relation Age of Onset  . Heart disease Father   . Diabetes Mother   . Heart disease Maternal Grandmother   . Stroke Maternal Grandmother     History  Substance Use Topics  . Smoking status: Former Smoker -- 0.75 packs/day    Types: Cigarettes    Quit date: 05/21/2013  . Smokeless tobacco: Never Used  . Alcohol Use: No    Review of Systems   As noted in HPI  Filed Vitals:   09/23/13 0912  BP: 131/85  Pulse: 63  Temp: 98.3 F (36.8 C)  Resp: 16    Physical Exam  Physical Exam  Constitutional: No distress.  Eyes: EOM are normal. Pupils are equal, round, and reactive to light.  Cardiovascular: Normal rate and regular rhythm.   Pulmonary/Chest: Breath sounds normal. No respiratory distress. He has no wheezes. He has no rales.  Musculoskeletal: He exhibits no edema.    CBC    Component Value Date/Time   WBC 6.3 08/09/2013 0655   RBC 4.82 08/09/2013 0655   HGB 13.9 08/09/2013 0655   HCT 40.9 08/09/2013 0655   PLT 245 08/09/2013 0655   MCV 84.9 08/09/2013 0655   LYMPHSABS 2.1 05/21/2013 1025   MONOABS 1.0 05/21/2013 1025   EOSABS 0.2 05/21/2013 1025   BASOSABS 0.0 05/21/2013 1025    CMP     Component Value Date/Time   NA 140 08/09/2013 0655   K 4.4 08/09/2013 0655   CL 104 08/09/2013 0655   CO2 23 08/09/2013 0655   GLUCOSE 103* 08/09/2013 0655   BUN 16 08/09/2013 0655   CREATININE 1.03 08/09/2013 0655   CREATININE 0.95 06/24/2013 0958   CALCIUM 9.1 08/09/2013 0655   PROT 7.1 06/24/2013 0958   ALBUMIN 4.1 06/24/2013 0958   AST 22 06/24/2013 0958   ALT 38 06/24/2013 0958   ALKPHOS 85 06/24/2013 0958   BILITOT 0.4 06/24/2013 0958   GFRNONAA >90 08/09/2013  0655   GFRNONAA >89  06/24/2013 0958   GFRAA >90 08/09/2013 0655   GFRAA >89 06/24/2013 0958    Lab Results  Component Value Date/Time   CHOL 110 06/24/2013  9:58 AM    No components found with this basename: hga1c    Lab Results  Component Value Date/Time   AST 22 06/24/2013  9:58 AM    Assessment and Plan  Chronic systolic heart failure - Plan: Continue with current medications, patient will also follow with his cardiologist.  lisinopril (PRINIVIL,ZESTRIL) 10 MG tablet, metoprolol tartrate (LOPRESSOR) 25 MG tablet, pravastatin (PRAVACHOL) 10 MG tablet  Gastroesophageal reflux disease without esophagitis Lifestyle modification.  Essential hypertension, benign - Plan: Blood pressure is well-controlled continued current medications, will repeat blood chemistry on the next visit. furosemide (LASIX) 40 MG tablet  Hypokalemia - Plan: potassium chloride (K-DUR,KLOR-CON) 10 MEQ tablet  Unspecified vitamin D deficiency Patient is going to start vitamin D 2000 units over the counter everyday.   Return in about 3 months (around 12/24/2013) for hypertension.  Doris CheadleADVANI, Oluwaseun Bruyere, MD

## 2013-09-23 NOTE — Progress Notes (Signed)
Patient here for follow up on his HTN and cholesterol 

## 2013-09-23 NOTE — Patient Instructions (Signed)
DASH Eating Plan  DASH stands for "Dietary Approaches to Stop Hypertension." The DASH eating plan is a healthy eating plan that has been shown to reduce high blood pressure (hypertension). Additional health benefits may include reducing the risk of type 2 diabetes mellitus, heart disease, and stroke. The DASH eating plan may also help with weight loss.  WHAT DO I NEED TO KNOW ABOUT THE DASH EATING PLAN?  For the DASH eating plan, you will follow these general guidelines:  · Choose foods with a percent daily value for sodium of less than 5% (as listed on the food label).  · Use salt-free seasonings or herbs instead of table salt or sea salt.  · Check with your health care provider or pharmacist before using salt substitutes.  · Eat lower-sodium products, often labeled as "lower sodium" or "no salt added."  · Eat fresh foods.  · Eat more vegetables, fruits, and low-fat dairy products.  · Choose whole grains. Look for the word "whole" as the first word in the ingredient list.  · Choose fish and skinless chicken or turkey more often than red meat. Limit fish, poultry, and meat to 6 oz (170 g) each day.  · Limit sweets, desserts, sugars, and sugary drinks.  · Choose heart-healthy fats.  · Limit cheese to 1 oz (28 g) per day.  · Eat more home-cooked food and less restaurant, buffet, and fast food.  · Limit fried foods.  · Cook foods using methods other than frying.  · Limit canned vegetables. If you do use them, rinse them well to decrease the sodium.  · When eating at a restaurant, ask that your food be prepared with less salt, or no salt if possible.  WHAT FOODS CAN I EAT?  Seek help from a dietitian for individual calorie needs.  Grains  Whole grain or whole wheat bread. Brown rice. Whole grain or whole wheat pasta. Quinoa, bulgur, and whole grain cereals. Low-sodium cereals. Corn or whole wheat flour tortillas. Whole grain cornbread. Whole grain crackers. Low-sodium crackers.  Vegetables  Fresh or frozen vegetables  (raw, steamed, roasted, or grilled). Low-sodium or reduced-sodium tomato and vegetable juices. Low-sodium or reduced-sodium tomato sauce and paste. Low-sodium or reduced-sodium canned vegetables.   Fruits  All fresh, canned (in natural juice), or frozen fruits.  Meat and Other Protein Products  Ground beef (85% or leaner), grass-fed beef, or beef trimmed of fat. Skinless chicken or turkey. Ground chicken or turkey. Pork trimmed of fat. All fish and seafood. Eggs. Dried beans, peas, or lentils. Unsalted nuts and seeds. Unsalted canned beans.  Dairy  Low-fat dairy products, such as skim or 1% milk, 2% or reduced-fat cheeses, low-fat ricotta or cottage cheese, or plain low-fat yogurt. Low-sodium or reduced-sodium cheeses.  Fats and Oils  Tub margarines without trans fats. Light or reduced-fat mayonnaise and salad dressings (reduced sodium). Avocado. Safflower, olive, or canola oils. Natural peanut or almond butter.  Other  Unsalted popcorn and pretzels.  The items listed above may not be a complete list of recommended foods or beverages. Contact your dietitian for more options.  WHAT FOODS ARE NOT RECOMMENDED?  Grains  White bread. White pasta. White rice. Refined cornbread. Bagels and croissants. Crackers that contain trans fat.  Vegetables  Creamed or fried vegetables. Vegetables in a cheese sauce. Regular canned vegetables. Regular canned tomato sauce and paste. Regular tomato and vegetable juices.  Fruits  Dried fruits. Canned fruit in light or heavy syrup. Fruit juice.  Meat and Other Protein   Products  Fatty cuts of meat. Ribs, chicken wings, bacon, sausage, bologna, salami, chitterlings, fatback, hot dogs, bratwurst, and packaged luncheon meats. Salted nuts and seeds. Canned beans with salt.  Dairy  Whole or 2% milk, cream, half-and-half, and cream cheese. Whole-fat or sweetened yogurt. Full-fat cheeses or blue cheese. Nondairy creamers and whipped toppings. Processed cheese, cheese spreads, or cheese  curds.  Condiments  Onion and garlic salt, seasoned salt, table salt, and sea salt. Canned and packaged gravies. Worcestershire sauce. Tartar sauce. Barbecue sauce. Teriyaki sauce. Soy sauce, including reduced sodium. Steak sauce. Fish sauce. Oyster sauce. Cocktail sauce. Horseradish. Ketchup and mustard. Meat flavorings and tenderizers. Bouillon cubes. Hot sauce. Tabasco sauce. Marinades. Taco seasonings. Relishes.  Fats and Oils  Butter, stick margarine, lard, shortening, ghee, and bacon fat. Coconut, palm kernel, or palm oils. Regular salad dressings.  Other  Pickles and olives. Salted popcorn and pretzels.  The items listed above may not be a complete list of foods and beverages to avoid. Contact your dietitian for more information.  WHERE CAN I FIND MORE INFORMATION?  National Heart, Lung, and [employer] Institute: www.nhlbi.nih.gov/health/health-topics/topics/dash/  Document Released: 02/28/2011 Document Revised: 03/16/2013 Document Reviewed: 01/13/2013  Exit[employer]® Patient Information ©2015 [employer]. This information is not intended to replace advice given to you by your health care provider. Make sure you discuss any questions you have with your health care provider.

## 2013-10-27 ENCOUNTER — Other Ambulatory Visit: Payer: Self-pay | Admitting: Internal Medicine

## 2013-11-01 ENCOUNTER — Other Ambulatory Visit: Payer: Self-pay | Admitting: Internal Medicine

## 2013-11-01 ENCOUNTER — Telehealth: Payer: Self-pay | Admitting: Internal Medicine

## 2013-11-01 DIAGNOSIS — E876 Hypokalemia: Secondary | ICD-10-CM

## 2013-11-01 DIAGNOSIS — I5022 Chronic systolic (congestive) heart failure: Secondary | ICD-10-CM

## 2013-11-01 MED ORDER — POTASSIUM CHLORIDE CRYS ER 10 MEQ PO TBCR: 20.0000 meq | EXTENDED_RELEASE_TABLET | Freq: Every day | ORAL | Status: DC

## 2013-11-01 NOTE — Telephone Encounter (Signed)
Pt. Would like a refill on metoprolol tartrate (LOPRESSOR) 25 MG tablet , potassium chloride (K-DUR,KLOR-CON) 10 MEQ tablet....Marland KitchenMarland Kitchenpatient has been out of medication for a week and is concerned as to wether or no he should continue taking these medication.Marland KitchenMarland KitchenMarland KitchenMarland Kitchenplease call patient.

## 2013-11-01 NOTE — Telephone Encounter (Signed)
Pt.'s mother is calling stating that pt. Ran out of medication and needs refill. Please f/u with pt.

## 2013-11-08 ENCOUNTER — Other Ambulatory Visit: Payer: Self-pay | Admitting: Emergency Medicine

## 2013-11-08 DIAGNOSIS — I5022 Chronic systolic (congestive) heart failure: Secondary | ICD-10-CM

## 2013-11-08 MED ORDER — LISINOPRIL 10 MG PO TABS: 10.0000 mg | ORAL_TABLET | Freq: Every day | ORAL | Status: DC

## 2013-11-08 MED ORDER — PRAVASTATIN SODIUM 10 MG PO TABS: 10.0000 mg | ORAL_TABLET | Freq: Every day | ORAL | Status: DC

## 2013-11-30 ENCOUNTER — Ambulatory Visit (HOSPITAL_COMMUNITY)
Admission: RE | Admit: 2013-11-30 | Discharge: 2013-11-30 | Disposition: A | Discharge status: Home / Self Care | Payer: BC Managed Care – PPO | Source: Ambulatory Visit | Attending: Surgery | Admitting: Surgery

## 2013-11-30 DIAGNOSIS — I2510 Atherosclerotic heart disease of native coronary artery without angina pectoris: Secondary | ICD-10-CM | POA: Diagnosis not present

## 2013-12-02 ENCOUNTER — Emergency Department (HOSPITAL_COMMUNITY): Payer: BC Managed Care – PPO

## 2013-12-02 ENCOUNTER — Encounter (HOSPITAL_COMMUNITY): Payer: Self-pay | Admitting: Emergency Medicine

## 2013-12-02 ENCOUNTER — Emergency Department (HOSPITAL_COMMUNITY)
Admission: EM | Admit: 2013-12-02 | Discharge: 2013-12-02 | Disposition: A | Discharge status: Home / Self Care | Payer: BC Managed Care – PPO | Attending: Emergency Medicine | Admitting: Emergency Medicine

## 2013-12-02 DIAGNOSIS — I252 Old myocardial infarction: Secondary | ICD-10-CM | POA: Insufficient documentation

## 2013-12-02 DIAGNOSIS — Z87448 Personal history of other diseases of urinary system: Secondary | ICD-10-CM | POA: Diagnosis not present

## 2013-12-02 DIAGNOSIS — R630 Anorexia: Secondary | ICD-10-CM | POA: Insufficient documentation

## 2013-12-02 DIAGNOSIS — Z9889 Other specified postprocedural states: Secondary | ICD-10-CM | POA: Insufficient documentation

## 2013-12-02 DIAGNOSIS — R61 Generalized hyperhidrosis: Secondary | ICD-10-CM | POA: Insufficient documentation

## 2013-12-02 DIAGNOSIS — Z7982 Long term (current) use of aspirin: Secondary | ICD-10-CM | POA: Insufficient documentation

## 2013-12-02 DIAGNOSIS — I2510 Atherosclerotic heart disease of native coronary artery without angina pectoris: Secondary | ICD-10-CM | POA: Diagnosis not present

## 2013-12-02 DIAGNOSIS — Z8719 Personal history of other diseases of the digestive system: Secondary | ICD-10-CM | POA: Diagnosis not present

## 2013-12-02 DIAGNOSIS — Z79899 Other long term (current) drug therapy: Secondary | ICD-10-CM | POA: Diagnosis not present

## 2013-12-02 DIAGNOSIS — R000 Tachycardia, unspecified: Secondary | ICD-10-CM | POA: Insufficient documentation

## 2013-12-02 DIAGNOSIS — I10 Essential (primary) hypertension: Secondary | ICD-10-CM | POA: Insufficient documentation

## 2013-12-02 DIAGNOSIS — R112 Nausea with vomiting, unspecified: Secondary | ICD-10-CM | POA: Insufficient documentation

## 2013-12-02 DIAGNOSIS — Z87891 Personal history of nicotine dependence: Secondary | ICD-10-CM | POA: Insufficient documentation

## 2013-12-02 DIAGNOSIS — Z951 Presence of aortocoronary bypass graft: Secondary | ICD-10-CM | POA: Diagnosis not present

## 2013-12-02 DIAGNOSIS — R6883 Chills (without fever): Secondary | ICD-10-CM | POA: Insufficient documentation

## 2013-12-02 DIAGNOSIS — R1084 Generalized abdominal pain: Secondary | ICD-10-CM

## 2013-12-02 LAB — CBC WITH DIFFERENTIAL/PLATELET
Basophils Absolute: 0 10*3/uL (ref 0.0–0.1)
Basophils Relative: 0 % (ref 0–1)
Eosinophils Absolute: 0.1 10*3/uL (ref 0.0–0.7)
Eosinophils Relative: 1 % (ref 0–5)
HCT: 47.1 % (ref 39.0–52.0)
Hemoglobin: 16.6 g/dL (ref 13.0–17.0)
Lymphocytes Relative: 23 % (ref 12–46)
Lymphs Abs: 2 10*3/uL (ref 0.7–4.0)
MCH: 29.5 pg (ref 26.0–34.0)
MCHC: 35.2 g/dL (ref 30.0–36.0)
MCV: 83.7 fL (ref 78.0–100.0)
Monocytes Absolute: 0.5 10*3/uL (ref 0.1–1.0)
Monocytes Relative: 6 % (ref 3–12)
Neutro Abs: 6.3 10*3/uL (ref 1.7–7.7)
Neutrophils Relative %: 70 % (ref 43–77)
Platelets: 274 10*3/uL (ref 150–400)
RBC: 5.63 MIL/uL (ref 4.22–5.81)
RDW: 14 % (ref 11.5–15.5)
WBC: 8.9 10*3/uL (ref 4.0–10.5)

## 2013-12-02 LAB — COMPREHENSIVE METABOLIC PANEL
ALT: 35 U/L (ref 0–53)
AST: 27 U/L (ref 0–37)
Albumin: 4.6 g/dL (ref 3.5–5.2)
Alkaline Phosphatase: 80 U/L (ref 39–117)
Anion gap: 14 (ref 5–15)
BUN: 13 mg/dL (ref 6–23)
CO2: 25 mEq/L (ref 19–32)
Calcium: 9.7 mg/dL (ref 8.4–10.5)
Chloride: 98 mEq/L (ref 96–112)
Creatinine, Ser: 1.2 mg/dL (ref 0.50–1.35)
GFR calc Af Amer: 89 mL/min — ABNORMAL LOW (ref >90)
GFR calc non Af Amer: 77 mL/min — ABNORMAL LOW (ref >90)
Glucose, Bld: 133 mg/dL — ABNORMAL HIGH (ref 70–99)
Potassium: 4.7 mEq/L (ref 3.7–5.3)
Sodium: 137 mEq/L (ref 137–147)
Total Bilirubin: 0.5 mg/dL (ref 0.3–1.2)
Total Protein: 8.3 g/dL (ref 6.0–8.3)

## 2013-12-02 LAB — LIPASE, BLOOD: Lipase: 29 U/L (ref 11–59)

## 2013-12-02 LAB — I-STAT TROPONIN, ED: Troponin i, poc: 0 ng/mL (ref 0.00–0.08)

## 2013-12-02 MED ORDER — IOHEXOL 300 MG/ML  SOLN: 25.0000 mL | INTRAMUSCULAR | Status: AC

## 2013-12-02 MED ORDER — ONDANSETRON HCL 4 MG/2ML IJ SOLN
4.0000 mg | Freq: Once | INTRAMUSCULAR | Status: AC
  Administered 2013-12-02: 4 mg via INTRAVENOUS
  Filled 2013-12-02: qty 2

## 2013-12-02 MED ORDER — SODIUM CHLORIDE 0.9 % IV BOLUS (SEPSIS)
1000.0000 mL | Freq: Once | INTRAVENOUS | Status: AC
  Administered 2013-12-02: 1000 mL via INTRAVENOUS

## 2013-12-02 MED ORDER — FAMOTIDINE IN NACL 20-0.9 MG/50ML-% IV SOLN
20.0000 mg | Freq: Once | INTRAVENOUS | Status: AC
  Administered 2013-12-02: 20 mg via INTRAVENOUS
  Filled 2013-12-02: qty 50

## 2013-12-02 MED ORDER — HYDROMORPHONE HCL PF 1 MG/ML IJ SOLN
1.0000 mg | Freq: Once | INTRAMUSCULAR | Status: AC
  Administered 2013-12-02: 1 mg via INTRAVENOUS
  Filled 2013-12-02: qty 1

## 2013-12-02 MED ORDER — PROMETHAZINE HCL 25 MG PO TABS: 25.0000 mg | ORAL_TABLET | Freq: Four times a day (QID) | ORAL | Status: DC | PRN

## 2013-12-02 MED ORDER — PANTOPRAZOLE SODIUM 40 MG IV SOLR
40.0000 mg | Freq: Once | INTRAVENOUS | Status: AC
  Administered 2013-12-02: 40 mg via INTRAVENOUS
  Filled 2013-12-02: qty 40

## 2013-12-02 MED ORDER — IOHEXOL 300 MG/ML  SOLN
80.0000 mL | Freq: Once | INTRAMUSCULAR | Status: AC | PRN
  Administered 2013-12-02: 80 mL via INTRAVENOUS

## 2013-12-02 MED ORDER — SODIUM CHLORIDE 0.9 % IV BOLUS (SEPSIS): 1000.0000 mL | Freq: Once | INTRAVENOUS | Status: DC

## 2013-12-02 NOTE — ED Notes (Signed)
Pt able to drink Gatorade and keep it down

## 2013-12-02 NOTE — ED Provider Notes (Signed)
CSN: [id number]     Arrival date & time 12/02/13  1010 History   First MD Initiated Contact with Patient 12/02/13 1017     Chief Complaint  Patient presents with  . Abdominal Pain  . Emesis     (Consider location/radiation/quality/duration/timing/severity/associated sxs/prior Treatment) HPI Ricardo [name] is a 33 y.o. male who presents with  Chief Complaint  Patient presents with  . Abdominal Pain  . Emesis   with history of HTN, renal disorder, CAD, CABG x4 05/24/13 followed by Dr. Herbie Baltimore, cardiology, presenting to ED with c/o 7 day hx of vomiting, worsened this morning with severe abdominal pain.  Pt states abdominal pain is diffuse but worse in epigastrium, cramping, 10/10 at worst.  Pt states he has been having 2-3 episodes of emesis over last 7 days but states this morning he has had about 5-6 episodes of emesis, "it's nothing but yellow."  Denies having diarrhea, cough or congestion. Denies sick contacts at home or recent travel. Denies chest pain or SOB. Pt reports being diaphoretic but is unsure if he had a fever.       Past Medical History  Diagnosis Date  . Migraine   . Hypertension   . Renal disorder   . NSTEMI (non-ST elevated myocardial infarction) 05/21/2013  . CAD, multiple vessel 05/21/2013    RCA & LCx 100%, 90% large RI, 50-70% segmental Prox LAD, 90% D1; EF 35-40% (basal-mid Inf Akinesis & severe HK of mid-anterolateral wall on Echo)  . 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 Ischemic cardiomyopathy 05/21/2013   Past Surgical History  Procedure Laterality Date  . Coronary artery bypass graft N/A 05/25/2013    Procedure: CORONARY ARTERY BYPASS GRAFTING (CABG);  Surgeon: Alleen Borne, MD;  Location: [facility] OR;  Service: Open Heart Surgery;  Laterality: N/A;  . Intraoperative transesophageal echocardiogram N/A 05/25/2013    Procedure: INTRAOPERATIVE TRANSESOPHAGEAL ECHOCARDIOGRAM;  Surgeon: Alleen Borne, MD;  Location: [facility] OR;  Service: Open Heart  Surgery;  Laterality: N/A;  . Endovein harvest of greater saphenous vein Right 05/25/2013    Procedure: ENDOVEIN HARVEST OF GREATER SAPHENOUS VEIN;  Surgeon: Alleen Borne, MD;  Location: [facility] OR;  Service: Open Heart Surgery;  Laterality: Right;  . Cardiac catheterization  05/21/2013    RCA & LCx 100%, 90% large RI, 50-70% segmental Prox LAD, 90% D1   Family History  Problem Relation Age of Onset  . Heart disease Father   . Diabetes Mother   . Heart disease Maternal Grandmother   . Stroke Maternal Grandmother    History  Substance Use Topics  . Smoking status: Former Smoker -- 0.75 packs/day    Types: Cigarettes    Quit date: 05/21/2013  . Smokeless tobacco: Never Used  . Alcohol Use: No    Review of Systems  Constitutional: Positive for chills, diaphoresis and appetite change. Negative for fever ( unknown).  Respiratory: Negative for cough and shortness of breath.   Cardiovascular: Negative for chest pain and palpitations.  Gastrointestinal: Positive for nausea, vomiting and abdominal pain. Negative for diarrhea.  Genitourinary: Negative for dysuria, urgency, hematuria and flank pain.  Musculoskeletal: Negative for back pain and myalgias.  All other systems reviewed and are negative.     Allergies  Review of patient's allergies indicates no known allergies.  Home Medications   Prior to Admission medications   Medication Sig Start Date End Date Taking? Authorizing Provider  acetaminophen (TYLENOL) 500 MG tablet Take 1,000  mg by mouth every 6 (six) hours as needed for mild pain.   Yes Historical Provider, MD  aspirin EC 325 MG EC tablet Take 1 tablet (325 mg total) by mouth daily. 05/29/13  Yes Gina L Collins, PA-C  furosemide (LASIX) 40 MG tablet Take 1 tablet (40 mg total) by mouth daily. X 7 DAYS  STARTING 06/07/12 09/23/13  Yes Deepak Advani, MD  lisinopril (PRINIVIL,ZESTRIL) 10 MG tablet Take 1 tablet (10 mg total) by mouth daily. 11/08/13  Yes Doris Cheadle, MD  metoprolol  tartrate (LOPRESSOR) 25 MG tablet Take 25 mg by mouth 2 (two) times daily.   Yes Historical Provider, MD  potassium chloride (K-DUR,KLOR-CON) 10 MEQ tablet Take 2 tablets (20 mEq total) by mouth daily. X 7 DAYS    STARTING 06/07/12 11/01/13  Yes Deepak Advani, MD  pravastatin (PRAVACHOL) 10 MG tablet Take 1 tablet (10 mg total) by mouth daily. 11/08/13  Yes Doris Cheadle, MD  promethazine (PHENERGAN) 25 MG tablet Take 1 tablet (25 mg total) by mouth every 6 (six) hours as needed for nausea or vomiting. 12/02/13   Junius Finner, PA-C   There were no vitals taken for this visit. Physical Exam  Nursing note and vitals reviewed. Constitutional: He appears well-developed and well-nourished. He appears distressed.  Pt is on hands and knees, holding emesis bag, actively vomiting, diaphoretic   HENT:  Head: Normocephalic and atraumatic.  Eyes: Conjunctivae are normal. No scleral icterus.  Neck: Normal range of motion.  Cardiovascular: Regular rhythm and normal heart sounds.  Tachycardia present.   Pulmonary/Chest: Effort normal and breath sounds normal. No respiratory distress. He has no wheezes. He has no rales. He exhibits no tenderness.  Abdominal: Soft. Bowel sounds are normal. He exhibits no distension and no mass. There is tenderness. There is guarding. There is no rebound and no CVA tenderness.  Obese abdomen, well healed surgical scars from CABG. Severe tenderness in epigastrium with guarding.  No CVAT  Musculoskeletal: Normal range of motion.  Neurological: He is alert.  Skin: Skin is warm. He is diaphoretic.    ED Course  Procedures (including critical care time) Labs Review Labs Reviewed  COMPREHENSIVE METABOLIC PANEL - Abnormal; Notable for the following:    Glucose, Bld 133 (*)    GFR calc non Af Amer 77 (*)    GFR calc Af Amer 89 (*)    All other components within normal limits  LIPASE, BLOOD  CBC WITH DIFFERENTIAL  URINALYSIS, ROUTINE W REFLEX MICROSCOPIC  I-STAT TROPOININ, ED     Imaging Review Dg Chest 2 View  11/30/2013   CLINICAL DATA:  Coronary artery disease  EXAM: CHEST  2 VIEW  COMPARISON:  Chest x-ray of 08/09/2013  FINDINGS: No active infiltrate or effusion is seen. Mediastinal and hilar contours are unremarkable. Mild cardiomegaly is stable. Median sternotomy sutures are noted from prior CABG. No bony abnormality is seen.  IMPRESSION: No active cardiopulmonary disease.   Electronically Signed   By: Dwyane Dee M.D.   On: 11/30/2013 11:20     EKG Interpretation   Date/Time:  Thursday December 02 2013 11:00:44 EDT Ventricular Rate:  61 PR Interval:  194 QRS Duration: 101 QT Interval:  465 QTC Calculation: 468 R Axis:   -94 Text Interpretation:  Sinus rhythm Inferior infarct, age indeterminate  Abnormal lateral Q waves Q waves present in previous tracing  Confirmed by  YAO  MD, DAVID ([id number]) on 12/02/2013 11:02:55 AM      MDM   Final  diagnoses:  Generalized abdominal pain  Non-intractable vomiting with nausea, vomiting of unspecified type  History of gastroesophageal reflux (GERD)    Pt is a 33yo male c/o severe abdominal pain, associated with nausea and vomiting. Vomiting started 7 days ago, abdominal pain this morning.  Pt appeared to be in distress, diaphoretic, tenderness in epigastrium. Due to presentation, concern for perforation, SBO, cholecystitis, not limited due cardiovascular cause due to pt's hx.  Will give pt IV fluids, zofran and dilaudid. Pt still c/o abdominal discomfort, pt given pepcid and protonix.   Labs: unremarkable Acute abd w/ chest and CT abd: unremarkable.  Discussed pt with Dr. Silverio Lay, agrees symptoms likely due to gastritis.   1:57 PM Pt resting comfortably in exam bed.  States he is feeling better. Reviewed labs and imaging with pt. Will discharge pt home to f/u with [facility] as pt and wife state he has similar symptoms happen continuously.  Pt also concerned he has worsening acid reflux. Pt currently takes  zantac.  Pt was able to keep down several ounces of Gatorade w/o difficulty. Will discharge home with phenergan and home care instructions.   Discussed pt with Dr. Silverio Lay throughout pt encounter, agrees with assessment and tx plan.    Junius Finner, PA-C 12/02/13 1439  Junius Finner, PA-C 12/02/13 1451

## 2013-12-02 NOTE — ED Notes (Signed)
Patient states abdominal pain and vomiting x 7 days, cramping pain described as cramping

## 2013-12-02 NOTE — ED Provider Notes (Signed)
Medical screening examination/treatment/procedure(s) were performed by non-physician practitioner and as supervising physician I was immediately available for consultation/collaboration.   EKG Interpretation   Date/Time:  Thursday December 02 2013 11:00:44 EDT Ventricular Rate:  61 PR Interval:  194 QRS Duration: 101 QT Interval:  465 QTC Calculation: 468 R Axis:   -94 Text Interpretation:  Sinus rhythm Inferior infarct, age indeterminate  Abnormal lateral Q waves Q waves present in previous tracing  Confirmed by  YAO  MD, DAVID ([id number]) on 12/02/2013 11:02:55 AM        Richardean Canal, MD 12/02/13 [phone]

## 2013-12-02 NOTE — ED Notes (Signed)
Patient in NAD at time of d/c 

## 2013-12-03 ENCOUNTER — Telehealth: Payer: Self-pay | Admitting: Cardiology

## 2013-12-03 NOTE — Telephone Encounter (Signed)
Pt had a procedure done on 12/02/13 at [facility], pt was calling to see if a nurse could share his results from the procedure. Pls contact pt.

## 2013-12-06 ENCOUNTER — Telehealth: Payer: Self-pay | Admitting: Cardiology

## 2013-12-06 NOTE — Telephone Encounter (Signed)
Pt. Calling about his results. Please f/u with pt.

## 2013-12-07 NOTE — Telephone Encounter (Signed)
Patient is aware that all labs and imagings are normal with the exception of noted diverticulosis. Patient instructed on high fiber diet. Pt states he no longer has abdominal pain. Patient requesting f/u visit for medical clearance to return to work following CABG in March 2015. Appointment for f/u with cardiologist made for 12/15/13 at 11:15.

## 2013-12-15 ENCOUNTER — Ambulatory Visit: Payer: BC Managed Care – PPO | Attending: Cardiology | Admitting: Cardiology

## 2013-12-15 ENCOUNTER — Encounter: Payer: Self-pay | Admitting: Cardiology

## 2013-12-15 VITALS — BP 114/78 | HR 69 | Temp 97.7°F | Resp 18 | Ht 69.0 in | Wt 231.6 lb

## 2013-12-15 DIAGNOSIS — Z79899 Other long term (current) drug therapy: Secondary | ICD-10-CM | POA: Insufficient documentation

## 2013-12-15 DIAGNOSIS — Z951 Presence of aortocoronary bypass graft: Secondary | ICD-10-CM | POA: Diagnosis not present

## 2013-12-15 DIAGNOSIS — I501 Left ventricular failure: Secondary | ICD-10-CM | POA: Diagnosis not present

## 2013-12-15 DIAGNOSIS — K219 Gastro-esophageal reflux disease without esophagitis: Secondary | ICD-10-CM | POA: Diagnosis not present

## 2013-12-15 DIAGNOSIS — Z23 Encounter for immunization: Secondary | ICD-10-CM

## 2013-12-15 DIAGNOSIS — I2589 Other forms of chronic ischemic heart disease: Secondary | ICD-10-CM | POA: Diagnosis not present

## 2013-12-15 DIAGNOSIS — E785 Hyperlipidemia, unspecified: Secondary | ICD-10-CM

## 2013-12-15 DIAGNOSIS — Z7982 Long term (current) use of aspirin: Secondary | ICD-10-CM | POA: Insufficient documentation

## 2013-12-15 DIAGNOSIS — I252 Old myocardial infarction: Secondary | ICD-10-CM | POA: Insufficient documentation

## 2013-12-15 DIAGNOSIS — I2510 Atherosclerotic heart disease of native coronary artery without angina pectoris: Secondary | ICD-10-CM | POA: Diagnosis not present

## 2013-12-15 DIAGNOSIS — I255 Ischemic cardiomyopathy: Secondary | ICD-10-CM

## 2013-12-15 DIAGNOSIS — I10 Essential (primary) hypertension: Secondary | ICD-10-CM | POA: Insufficient documentation

## 2013-12-15 DIAGNOSIS — K2100 Gastro-esophageal reflux disease with esophagitis, without bleeding: Secondary | ICD-10-CM

## 2013-12-15 DIAGNOSIS — I5022 Chronic systolic (congestive) heart failure: Secondary | ICD-10-CM | POA: Diagnosis not present

## 2013-12-15 MED ORDER — FUROSEMIDE 40 MG PO TABS: 40.0000 mg | ORAL_TABLET | Freq: Every day | ORAL | Status: DC

## 2013-12-15 MED ORDER — LISINOPRIL 10 MG PO TABS: 10.0000 mg | ORAL_TABLET | Freq: Every day | ORAL | Status: DC

## 2013-12-15 MED ORDER — OMEPRAZOLE 20 MG PO CPDR: 20.0000 mg | DELAYED_RELEASE_CAPSULE | Freq: Every day | ORAL | Status: DC

## 2013-12-15 MED ORDER — ASPIRIN 325 MG PO TBEC: 325.0000 mg | DELAYED_RELEASE_TABLET | Freq: Every day | ORAL | Status: DC

## 2013-12-15 MED ORDER — METOPROLOL TARTRATE 25 MG PO TABS: 25.0000 mg | ORAL_TABLET | Freq: Two times a day (BID) | ORAL | Status: DC

## 2013-12-15 NOTE — Assessment & Plan Note (Signed)
We are hopeful that his left ventricular systolic function has improved several months after coronary artery revascularization. We'll obtain 2-D echocardiogram as an outpatient. We'll call with results. I'll see him back in 3 months.

## 2013-12-15 NOTE — Progress Notes (Signed)
Patient here for follow up visit Patient complains of acid reflux intermittently Patient is asking if he is physically able to work after incident with his heart problems

## 2013-12-15 NOTE — Progress Notes (Signed)
HPI Ricardo [name] returns today for the evaluation and management of his ischemic cardiomyopathy and chronic systolic heart failure.  Other than some chest Seniyah Esker soreness from his sternotomy he is doing well. He does have significant reflux particularly at night. He takes over-the-counter Zantac which is costing 60 dollars a month.  He is compliant with his medications. He recently was in the emergency room for a viral gastroenteritis. Blood work was unremarkable. His lipids are at goal.  His lower extremity edema has resolved. Still takes daily Lasix.  Past Medical History  Diagnosis Date  . Migraine   . Hypertension   . Renal disorder   . NSTEMI (non-ST elevated myocardial infarction) 05/21/2013  . CAD, multiple vessel 05/21/2013    RCA & LCx 100%, 90% large RI, 50-70% segmental Prox LAD, 90% D1; EF 35-40% (basal-mid Inf Akinesis & severe HK of mid-anterolateral Elliemae Braman on Echo)  . 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 Ischemic cardiomyopathy 05/21/2013    Current Outpatient Prescriptions  Medication Sig Dispense Refill  . aspirin 325 MG EC tablet Take 1 tablet (325 mg total) by mouth daily.  30 tablet  2  . furosemide (LASIX) 40 MG tablet Take 1 tablet (40 mg total) by mouth daily. X 7 DAYS  STARTING 06/07/12  30 tablet  2  . lisinopril (PRINIVIL,ZESTRIL) 10 MG tablet Take 1 tablet (10 mg total) by mouth daily.  30 tablet  3  . metoprolol tartrate (LOPRESSOR) 25 MG tablet Take 1 tablet (25 mg total) by mouth 2 (two) times daily.  60 tablet  0  . potassium chloride (K-DUR,KLOR-CON) 10 MEQ tablet Take 2 tablets (20 mEq total) by mouth daily. X 7 DAYS    STARTING 06/07/12  30 tablet  3  . pravastatin (PRAVACHOL) 10 MG tablet Take 1 tablet (10 mg total) by mouth daily.  30 tablet  3  . promethazine (PHENERGAN) 25 MG tablet Take 1 tablet (25 mg total) by mouth every 6 (six) hours as needed for nausea or vomiting.  10 tablet  0  . omeprazole (PRILOSEC) 20 MG capsule Take 1 capsule  (20 mg total) by mouth daily.  30 capsule  3   No current facility-administered medications for this visit.    No Known Allergies  Family History  Problem Relation Age of Onset  . Heart disease Father   . Diabetes Mother   . Heart disease Maternal Grandmother   . Stroke Maternal Grandmother     History   Social History  . Marital Status: Single    Spouse Name: N/A    Number of Children: N/A  . Years of Education: N/A   Occupational History  . Not on file.   Social History Main Topics  . Smoking status: Former Smoker -- 0.75 packs/day    Types: Cigarettes    Quit date: 05/21/2013  . Smokeless tobacco: Never Used  . Alcohol Use: No  . Drug Use: Yes    Special: Marijuana     Comment: last use 3 weeks ago   . Sexual Activity: Yes   Other Topics Concern  . Not on file   Social History Narrative  . No narrative on file    ROS ALL NEGATIVE EXCEPT THOSE NOTED IN HPI  PE  General Appearance: well developed, well nourished in no acute distress, overweight, muscular HEENT: symmetrical face, PERRLA, good dentition  Neck: no JVD, thyromegaly, or adenopathy, trachea midline Chest: symmetric without deformity Cardiac: PMI non-displaced,  RRR, normal S1, S2, no gallop or murmur Lung: clear to ausculation and percussion Vascular: all pulses full without bruits  Abdominal: nondistended, nontender, good bowel sounds, no HSM, no bruits Extremities: no cyanosis, clubbing or edema, no sign of DVT, no varicosities  Skin: normal color, no rashes Neuro: alert and oriented x 3, non-focal Pysch: normal affect  EKG  BMET    Component Value Date/Time   NA 137 12/02/2013 1040   K 4.7 12/02/2013 1040   CL 98 12/02/2013 1040   CO2 25 12/02/2013 1040   GLUCOSE 133* 12/02/2013 1040   BUN 13 12/02/2013 1040   CREATININE 1.20 12/02/2013 1040   CREATININE 0.95 06/24/2013 0958   CALCIUM 9.7 12/02/2013 1040   GFRNONAA 77* 12/02/2013 1040   GFRNONAA >89 06/24/2013 0958   GFRAA 89* 12/02/2013  1040   GFRAA >89 06/24/2013 0958    Lipid Panel     Component Value Date/Time   CHOL 110 06/24/2013 0958   TRIG 83 06/24/2013 0958   HDL 31* 06/24/2013 0958   CHOLHDL 3.5 06/24/2013 0958   VLDL 17 06/24/2013 0958   LDLCALC 62 06/24/2013 0958    CBC    Component Value Date/Time   WBC 8.9 12/02/2013 1040   RBC 5.63 12/02/2013 1040   HGB 16.6 12/02/2013 1040   HCT 47.1 12/02/2013 1040   PLT 274 12/02/2013 1040   MCV 83.7 12/02/2013 1040   MCH 29.5 12/02/2013 1040   MCHC 35.2 12/02/2013 1040   RDW 14.0 12/02/2013 1040   LYMPHSABS 2.0 12/02/2013 1040   MONOABS 0.5 12/02/2013 1040   EOSABS 0.1 12/02/2013 1040   BASOSABS 0.0 12/02/2013 1040

## 2013-12-15 NOTE — Patient Instructions (Signed)
Thanks for coming to see Dr. Daleen Squibb today Echocardiogram ordered Prilosec ordered for acid reflux Return in 3 months with Dr. Daleen Squibb

## 2013-12-15 NOTE — Assessment & Plan Note (Signed)
I changed his Zantac to generic Prilosec Q. at bedtime. Hopefully this will help more and it will cost him less.

## 2013-12-17 ENCOUNTER — Other Ambulatory Visit (HOSPITAL_COMMUNITY): Payer: BC Managed Care – PPO

## 2013-12-21 ENCOUNTER — Encounter (HOSPITAL_BASED_OUTPATIENT_CLINIC_OR_DEPARTMENT_OTHER): Payer: Self-pay | Admitting: Emergency Medicine

## 2013-12-21 ENCOUNTER — Emergency Department (HOSPITAL_COMMUNITY)
Admission: EM | Admit: 2013-12-21 | Discharge: 2013-12-21 | Discharge status: Against Medical Advice | Payer: BC Managed Care – PPO | Source: Home / Self Care

## 2013-12-21 ENCOUNTER — Emergency Department (HOSPITAL_BASED_OUTPATIENT_CLINIC_OR_DEPARTMENT_OTHER)
Admission: EM | Admit: 2013-12-21 | Discharge: 2013-12-21 | Disposition: A | Discharge status: Home / Self Care | Payer: BC Managed Care – PPO | Attending: Emergency Medicine | Admitting: Emergency Medicine

## 2013-12-21 ENCOUNTER — Encounter (HOSPITAL_COMMUNITY): Payer: Self-pay | Admitting: Emergency Medicine

## 2013-12-21 DIAGNOSIS — I2510 Atherosclerotic heart disease of native coronary artery without angina pectoris: Secondary | ICD-10-CM

## 2013-12-21 DIAGNOSIS — I10 Essential (primary) hypertension: Secondary | ICD-10-CM | POA: Insufficient documentation

## 2013-12-21 DIAGNOSIS — I252 Old myocardial infarction: Secondary | ICD-10-CM

## 2013-12-21 DIAGNOSIS — G43909 Migraine, unspecified, not intractable, without status migrainosus: Secondary | ICD-10-CM | POA: Diagnosis not present

## 2013-12-21 DIAGNOSIS — R1013 Epigastric pain: Secondary | ICD-10-CM | POA: Diagnosis not present

## 2013-12-21 DIAGNOSIS — R109 Unspecified abdominal pain: Secondary | ICD-10-CM

## 2013-12-21 DIAGNOSIS — Z7982 Long term (current) use of aspirin: Secondary | ICD-10-CM | POA: Insufficient documentation

## 2013-12-21 DIAGNOSIS — R1012 Left upper quadrant pain: Secondary | ICD-10-CM | POA: Insufficient documentation

## 2013-12-21 DIAGNOSIS — Z87891 Personal history of nicotine dependence: Secondary | ICD-10-CM | POA: Insufficient documentation

## 2013-12-21 DIAGNOSIS — Z951 Presence of aortocoronary bypass graft: Secondary | ICD-10-CM | POA: Insufficient documentation

## 2013-12-21 DIAGNOSIS — Z79899 Other long term (current) drug therapy: Secondary | ICD-10-CM | POA: Diagnosis not present

## 2013-12-21 DIAGNOSIS — Z87448 Personal history of other diseases of urinary system: Secondary | ICD-10-CM | POA: Insufficient documentation

## 2013-12-21 DIAGNOSIS — Z9889 Other specified postprocedural states: Secondary | ICD-10-CM | POA: Insufficient documentation

## 2013-12-21 DIAGNOSIS — R112 Nausea with vomiting, unspecified: Secondary | ICD-10-CM | POA: Diagnosis not present

## 2013-12-21 DIAGNOSIS — R1010 Upper abdominal pain, unspecified: Secondary | ICD-10-CM

## 2013-12-21 LAB — CBC WITH DIFFERENTIAL/PLATELET
BASOS PCT: 0 % (ref 0–1)
Basophils Absolute: 0 10*3/uL (ref 0.0–0.1)
EOS ABS: 0.1 10*3/uL (ref 0.0–0.7)
EOS PCT: 1 % (ref 0–5)
HCT: 48.9 % (ref 39.0–52.0)
Hemoglobin: 16.9 g/dL (ref 13.0–17.0)
Lymphocytes Relative: 27 % (ref 12–46)
Lymphs Abs: 1.7 10*3/uL (ref 0.7–4.0)
MCH: 29.2 pg (ref 26.0–34.0)
MCHC: 34.6 g/dL (ref 30.0–36.0)
MCV: 84.6 fL (ref 78.0–100.0)
Monocytes Absolute: 0.3 10*3/uL (ref 0.1–1.0)
Monocytes Relative: 5 % (ref 3–12)
Neutro Abs: 4.3 10*3/uL (ref 1.7–7.7)
Neutrophils Relative %: 67 % (ref 43–77)
PLATELETS: 291 10*3/uL (ref 150–400)
RBC: 5.78 MIL/uL (ref 4.22–5.81)
RDW: 13.9 % (ref 11.5–15.5)
WBC: 6.4 10*3/uL (ref 4.0–10.5)

## 2013-12-21 LAB — LIPASE, BLOOD: LIPASE: 20 U/L (ref 11–59)

## 2013-12-21 LAB — COMPREHENSIVE METABOLIC PANEL
ALK PHOS: 73 U/L (ref 39–117)
ALT: 22 U/L (ref 0–53)
AST: 23 U/L (ref 0–37)
Albumin: 4.6 g/dL (ref 3.5–5.2)
Anion gap: 13 (ref 5–15)
BUN: 11 mg/dL (ref 6–23)
CALCIUM: 10.3 mg/dL (ref 8.4–10.5)
CHLORIDE: 101 meq/L (ref 96–112)
CO2: 24 meq/L (ref 19–32)
Creatinine, Ser: 1.07 mg/dL (ref 0.50–1.35)
GFR calc Af Amer: >90 mL/min (ref >90)
GFR, EST NON AFRICAN AMERICAN: 88 mL/min — AB (ref >90)
GLUCOSE: 111 mg/dL — AB (ref 70–99)
Potassium: 5.1 mEq/L (ref 3.7–5.3)
SODIUM: 138 meq/L (ref 137–147)
Total Bilirubin: 0.9 mg/dL (ref 0.3–1.2)
Total Protein: 8.5 g/dL — ABNORMAL HIGH (ref 6.0–8.3)

## 2013-12-21 MED ORDER — HYDROMORPHONE HCL 1 MG/ML IJ SOLN
1.0000 mg | Freq: Once | INTRAMUSCULAR | Status: DC
  Filled 2013-12-21: qty 1

## 2013-12-21 MED ORDER — FAMOTIDINE IN NACL 20-0.9 MG/50ML-% IV SOLN
20.0000 mg | Freq: Once | INTRAVENOUS | Status: AC
  Administered 2013-12-21: 20 mg via INTRAVENOUS
  Filled 2013-12-21: qty 50

## 2013-12-21 MED ORDER — FENTANYL CITRATE 0.05 MG/ML IJ SOLN
100.0000 ug | Freq: Once | INTRAMUSCULAR | Status: AC
  Administered 2013-12-21: 100 ug via INTRAVENOUS
  Filled 2013-12-21: qty 2

## 2013-12-21 MED ORDER — SODIUM CHLORIDE 0.9 % IV BOLUS (SEPSIS)
1000.0000 mL | Freq: Once | INTRAVENOUS | Status: AC
  Administered 2013-12-21: 1000 mL via INTRAVENOUS

## 2013-12-21 MED ORDER — ONDANSETRON HCL 4 MG/2ML IJ SOLN
4.0000 mg | Freq: Once | INTRAMUSCULAR | Status: AC
  Administered 2013-12-21: 4 mg via INTRAVENOUS
  Filled 2013-12-21: qty 2

## 2013-12-21 MED ORDER — RANITIDINE HCL 150 MG PO TABS: 150.0000 mg | ORAL_TABLET | Freq: Two times a day (BID) | ORAL | Status: DC

## 2013-12-21 NOTE — ED Provider Notes (Signed)
CSN: [id number]     Arrival date & time 12/21/13  1557 History   First MD Initiated Contact with Patient 12/21/13 1612     Chief Complaint  Patient presents with  . Abdominal Pain     (Consider location/radiation/quality/duration/timing/severity/associated sxs/prior Treatment) HPI 33 year old male presents with 2 days of epigastric abdominal pain. Describes the pain as a twisting sensation. It does not radiate. No chest pain, shortness of breath, or back pain. The nausea has been spitting up "acid". No diarrhea. He's felt abdominal pain like this before multiple times in the past. He has not had a diagnosis for what is causing his pain. 6 months ago he had a cardiac bypass her symptoms not related to this. Denies fevers or chills. At this point the pain as a "20". [facility] CBC and CMP drawn but did not wait and came here instead. He is requesting a referral to a gastroenterologist.  Past Medical History  Diagnosis Date  . Migraine   . Hypertension   . Renal disorder   . NSTEMI (non-ST elevated myocardial infarction) 05/21/2013  . CAD, multiple vessel 05/21/2013    RCA & LCx 100%, 90% large RI, 50-70% segmental Prox LAD, 90% D1; EF 35-40% (basal-mid Inf Akinesis & severe HK of mid-anterolateral wall on Echo)  . 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 Ischemic cardiomyopathy 05/21/2013   Past Surgical History  Procedure Laterality Date  . Coronary artery bypass graft N/A 05/25/2013    Procedure: CORONARY ARTERY BYPASS GRAFTING (CABG);  Surgeon: Alleen BorneBryan K Bartle, MD;  [facility];  Service: Open Heart Surgery;  Laterality: N/A;  . Intraoperative transesophageal echocardiogram N/A 05/25/2013    Procedure: INTRAOPERATIVE TRANSESOPHAGEAL ECHOCARDIOGRAM;  Surgeon: Alleen BorneBryan K Bartle, MD;  [facility];  Service: Open Heart Surgery;  Laterality: N/A;  . Endovein harvest of greater saphenous vein Right 05/25/2013    Procedure: ENDOVEIN HARVEST OF GREATER  SAPHENOUS VEIN;  Surgeon: Alleen BorneBryan K Bartle, MD;  Location: [facility] OR;  Service: Open Heart Surgery;  Laterality: Right;  . Cardiac catheterization  05/21/2013    RCA & LCx 100%, 90% large RI, 50-70% segmental Prox LAD, 90% D1   Family History  Problem Relation Age of Onset  . Heart disease Father   . Diabetes Mother   . Heart disease Maternal Grandmother   . Stroke Maternal Grandmother    History  Substance Use Topics  . Smoking status: Former Smoker -- 0.75 packs/day    Types: Cigarettes    Quit date: 05/21/2013  . Smokeless tobacco: Never Used  . Alcohol Use: No    Review of Systems  Constitutional: Negative for fever.  Respiratory: Negative for shortness of breath.   Cardiovascular: Negative for chest pain.  Gastrointestinal: Positive for nausea, vomiting and abdominal pain.  Genitourinary: Negative for dysuria.  Musculoskeletal: Negative for back pain.  All other systems reviewed and are negative.     Allergies  Review of patient's allergies indicates no known allergies.  Home Medications   Prior to Admission medications   Medication Sig Start Date End Date Taking? Authorizing Provider  aspirin 325 MG EC tablet Take 1 tablet (325 mg total) by mouth daily. 12/15/13   Gaylord Shihhomas C Wall, MD  furosemide (LASIX) 40 MG tablet Take 1 tablet (40 mg total) by mouth daily. X 7 DAYS  STARTING 06/07/12 12/15/13   Gaylord Shihhomas C Wall, MD  lisinopril (PRINIVIL,ZESTRIL) 10 MG tablet Take 1 tablet (  10 mg total) by mouth daily. 12/15/13   Gaylord Shih, MD  metoprolol tartrate (LOPRESSOR) 25 MG tablet Take 1 tablet (25 mg total) by mouth 2 (two) times daily. 12/15/13   Gaylord Shih, MD  omeprazole (PRILOSEC) 20 MG capsule Take 1 capsule (20 mg total) by mouth daily. 12/15/13   Gaylord Shih, MD  potassium chloride (K-DUR,KLOR-CON) 10 MEQ tablet Take 2 tablets (20 mEq total) by mouth daily. X 7 DAYS    STARTING 06/07/12 11/01/13   Doris Cheadle, MD  pravastatin (PRAVACHOL) 10 MG tablet Take 1 tablet (10 mg  total) by mouth daily. 11/08/13   Doris Cheadle, MD  promethazine (PHENERGAN) 25 MG tablet Take 1 tablet (25 mg total) by mouth every 6 (six) hours as needed for nausea or vomiting. 12/02/13   Junius Finner, PA-C   BP 155/90  Pulse 64  Temp(Src) 98.1 F (36.7 C) (Oral)  Resp 20  SpO2 100% Physical Exam  Nursing note and vitals reviewed. Constitutional: He is oriented to person, place, and time. He appears well-developed and well-nourished.  Pacing back and forth in the room  HENT:  Head: Normocephalic and atraumatic.  Right Ear: External ear normal.  Left Ear: External ear normal.  Nose: Nose normal.  Eyes: Right eye exhibits no discharge. Left eye exhibits no discharge.  Neck: Neck supple.  Cardiovascular: Normal rate, regular rhythm, normal heart sounds and intact distal pulses.   Pulmonary/Chest: Effort normal and breath sounds normal.  Well healed midline chest scar  Abdominal: Soft. There is tenderness in the epigastric area and left upper quadrant.  Musculoskeletal: He exhibits no edema.  Neurological: He is alert and oriented to person, place, and time.  Skin: Skin is warm and dry. He is not diaphoretic.    ED Course  Procedures (including critical care time) Labs Review Labs Reviewed  LIPASE, BLOOD    Imaging Review No results found.   EKG Interpretation None      MDM   Final diagnoses:  Pain of upper abdomen    CBC, CMP were obtained from other hospital are normal. Lipase is normal. Symptoms significantly improved with antacids and one dose of pain medicine. Patient is now very comfortable, having no vomiting, and mild pain. Given his recurrent symptoms is likely a gastritis. He does take ibuprofen intermittently which I discussed stopping. He was recently switched from Zantac to Prilosec, will add back on Zantac and give gastroenterology referral. He had a CT scan for similar symptoms earlier this month, but I do not feel repeat imaging is warned that this  time.    Audree Camel, MD 12/21/13 2152

## 2013-12-21 NOTE — Discharge Instructions (Signed)
Abdominal Pain °Many things can cause abdominal pain. Usually, abdominal pain is not caused by a disease and will improve without treatment. It can often be observed and treated at home. Your health care provider will do a physical exam and possibly order blood tests and X-rays to help determine the seriousness of your pain. However, in many cases, more time must pass before a clear cause of the pain can be found. Before that point, your health care provider may not know if you need more testing or further treatment. °HOME CARE INSTRUCTIONS  °Monitor your abdominal pain for any changes. The following actions may help to alleviate any discomfort you are experiencing: °· Only take over-the-counter or prescription medicines as directed by your health care provider. °· Do not take laxatives unless directed to do so by your health care provider. °· Try a clear liquid diet (broth, tea, or water) as directed by your health care provider. Slowly move to a bland diet as tolerated. °SEEK MEDICAL CARE IF: °· You have unexplained abdominal pain. °· You have abdominal pain associated with nausea or diarrhea. °· You have pain when you urinate or have a bowel movement. °· You experience abdominal pain that wakes you in the night. °· You have abdominal pain that is worsened or improved by eating food. °· You have abdominal pain that is worsened with eating fatty foods. °· You have a fever. °SEEK IMMEDIATE MEDICAL CARE IF:  °· Your pain does not go away within 2 hours. °· You keep throwing up (vomiting). °· Your pain is felt only in portions of the abdomen, such as the right side or the left lower portion of the abdomen. °· You pass bloody or black tarry stools. °MAKE SURE YOU: °· Understand these instructions.   °· Will watch your condition.   °· Will get help right away if you are not doing well or get worse.   °Document Released: 12/19/2004 Document Revised: 03/16/2013 Document Reviewed: 11/18/2012 °Exit[employer]® Patient Information  ©2015 [employer]. This information is not intended to replace advice given to you by your health care provider. Make sure you discuss any questions you have with your health care provider. ° °Gastritis, Adult °Gastritis is soreness and swelling (inflammation) of the lining of the stomach. Gastritis can develop as a sudden onset (acute) or long-term (chronic) condition. If gastritis is not treated, it can lead to stomach bleeding and ulcers. °CAUSES  °Gastritis occurs when the stomach lining is weak or damaged. Digestive juices from the stomach then inflame the weakened stomach lining. The stomach lining may be weak or damaged due to viral or bacterial infections. One common bacterial infection is the Helicobacter pylori infection. Gastritis can also result from excessive alcohol consumption, taking certain medicines, or having too much acid in the stomach.  °SYMPTOMS  °In some cases, there are no symptoms. When symptoms are present, they may include: °· Pain or a burning sensation in the upper abdomen. °· Nausea. °· Vomiting. °· An uncomfortable feeling of fullness after eating. °DIAGNOSIS  °Your caregiver may suspect you have gastritis based on your symptoms and a physical exam. To determine the cause of your gastritis, your caregiver may perform the following: °· Blood or stool tests to check for the H pylori bacterium. °· Gastroscopy. A thin, flexible tube (endoscope) is passed down the esophagus and into the stomach. The endoscope has a light and camera on the end. Your caregiver uses the endoscope to view the inside of the stomach. °· Taking a tissue sample (biopsy)   from the stomach to examine under a microscope. °TREATMENT  °Depending on the cause of your gastritis, medicines may be prescribed. If you have a bacterial infection, such as an H pylori infection, antibiotics may be given. If your gastritis is caused by too much acid in the stomach, H2 blockers or antacids may be given. Your caregiver may  recommend that you stop taking aspirin, ibuprofen, or other nonsteroidal anti-inflammatory drugs (NSAIDs). °HOME CARE INSTRUCTIONS °· Only take over-the-counter or prescription medicines as directed by your caregiver. °· If you were given antibiotic medicines, take them as directed. Finish them even if you start to feel better. °· Drink enough fluids to keep your urine clear or pale yellow. °· Avoid foods and drinks that make your symptoms worse, such as: °¨ Caffeine or alcoholic drinks. °¨ Chocolate. °¨ Peppermint or mint flavorings. °¨ Garlic and onions. °¨ Spicy foods. °¨ Citrus fruits, such as oranges, lemons, or limes. °¨ Tomato-based foods such as sauce, chili, salsa, and pizza. °¨ Fried and fatty foods. °· Eat small, frequent meals instead of large meals. °SEEK IMMEDIATE MEDICAL CARE IF:  °· You have black or dark red stools. °· You vomit blood or material that looks like coffee grounds. °· You are unable to keep fluids down. °· Your abdominal pain gets worse. °· You have a fever. °· You do not feel better after 1 week. °· You have any other questions or concerns. °MAKE SURE YOU: °· Understand these instructions. °· Will watch your condition. °· Will get help right away if you are not doing well or get worse. °Document Released: 03/05/2001 Document Revised: 09/10/2011 Document Reviewed: 04/24/2011 °ExitCare® Patient Information ©2015 [employer]. This information is not intended to replace advice given to you by your health care provider. Make sure you discuss any questions you have with your health care provider. ° °

## 2013-12-21 NOTE — ED Notes (Signed)
Pt told registration he was leaving and registration notified triage. Pt was called by another RN as he was leaving the ED and pt kept going. To make sure and patient was not in the lobby this RN attempted to call again.

## 2013-12-21 NOTE — ED Notes (Signed)
Pt with large cardiac hx.  Will do EKG

## 2013-12-21 NOTE — ED Notes (Signed)
Pt reports a recurrence of his abdominal pain 2 days ago, and has seen PCP for same. Pt restless and irritable, poor historian

## 2013-12-21 NOTE — ED Notes (Signed)
Pt here last week with abdominal pain. Pain continues.  Nausea but no vomiting.  States more spitting up liquids

## 2014-02-25 ENCOUNTER — Ambulatory Visit: Payer: BC Managed Care – PPO | Admitting: Family Medicine

## 2014-03-03 ENCOUNTER — Encounter (HOSPITAL_COMMUNITY): Payer: Self-pay | Admitting: Cardiology

## 2014-04-05 ENCOUNTER — Encounter: Payer: Self-pay | Admitting: Internal Medicine

## 2014-04-05 ENCOUNTER — Ambulatory Visit: Payer: BLUE CROSS/BLUE SHIELD | Attending: Internal Medicine | Admitting: Internal Medicine

## 2014-04-05 VITALS — BP 126/75 | HR 67 | Temp 98.0°F | Resp 16 | Wt 237.6 lb

## 2014-04-05 DIAGNOSIS — Z7982 Long term (current) use of aspirin: Secondary | ICD-10-CM | POA: Insufficient documentation

## 2014-04-05 DIAGNOSIS — I252 Old myocardial infarction: Secondary | ICD-10-CM | POA: Insufficient documentation

## 2014-04-05 DIAGNOSIS — I10 Essential (primary) hypertension: Secondary | ICD-10-CM | POA: Diagnosis not present

## 2014-04-05 DIAGNOSIS — Z79899 Other long term (current) drug therapy: Secondary | ICD-10-CM | POA: Insufficient documentation

## 2014-04-05 DIAGNOSIS — I5022 Chronic systolic (congestive) heart failure: Secondary | ICD-10-CM | POA: Diagnosis present

## 2014-04-05 DIAGNOSIS — I2510 Atherosclerotic heart disease of native coronary artery without angina pectoris: Secondary | ICD-10-CM | POA: Insufficient documentation

## 2014-04-05 DIAGNOSIS — Z87891 Personal history of nicotine dependence: Secondary | ICD-10-CM | POA: Diagnosis not present

## 2014-04-05 DIAGNOSIS — E876 Hypokalemia: Secondary | ICD-10-CM | POA: Diagnosis not present

## 2014-04-05 DIAGNOSIS — E785 Hyperlipidemia, unspecified: Secondary | ICD-10-CM | POA: Diagnosis not present

## 2014-04-05 DIAGNOSIS — I255 Ischemic cardiomyopathy: Secondary | ICD-10-CM | POA: Insufficient documentation

## 2014-04-05 DIAGNOSIS — Z951 Presence of aortocoronary bypass graft: Secondary | ICD-10-CM | POA: Insufficient documentation

## 2014-04-05 DIAGNOSIS — K219 Gastro-esophageal reflux disease without esophagitis: Secondary | ICD-10-CM | POA: Diagnosis not present

## 2014-04-05 LAB — LIPID PANEL
CHOLESTEROL: 169 mg/dL (ref 0–200)
HDL: 36 mg/dL — ABNORMAL LOW (ref >39)
LDL CALC: 112 mg/dL — AB (ref 0–99)
Total CHOL/HDL Ratio: 4.7 Ratio
Triglycerides: 105 mg/dL (ref <150)
VLDL: 21 mg/dL (ref 0–40)

## 2014-04-05 LAB — COMPLETE METABOLIC PANEL WITH GFR
ALT: 37 U/L (ref 0–53)
AST: 34 U/L (ref 0–37)
Albumin: 4 g/dL (ref 3.5–5.2)
Alkaline Phosphatase: 66 U/L (ref 39–117)
BUN: 10 mg/dL (ref 6–23)
CHLORIDE: 104 meq/L (ref 96–112)
CO2: 28 mEq/L (ref 19–32)
Calcium: 9.4 mg/dL (ref 8.4–10.5)
Creat: 1.09 mg/dL (ref 0.50–1.35)
GFR, Est Non African American: 87 mL/min
GLUCOSE: 83 mg/dL (ref 70–99)
POTASSIUM: 4.1 meq/L (ref 3.5–5.3)
Sodium: 140 mEq/L (ref 135–145)
TOTAL PROTEIN: 6.7 g/dL (ref 6.0–8.3)
Total Bilirubin: 0.7 mg/dL (ref 0.2–1.2)

## 2014-04-05 MED ORDER — METOPROLOL TARTRATE 25 MG PO TABS: 25.0000 mg | ORAL_TABLET | Freq: Two times a day (BID) | ORAL | Status: DC

## 2014-04-05 MED ORDER — PRAVASTATIN SODIUM 10 MG PO TABS: 10.0000 mg | ORAL_TABLET | Freq: Every day | ORAL | Status: DC

## 2014-04-05 MED ORDER — POTASSIUM CHLORIDE CRYS ER 10 MEQ PO TBCR: 20.0000 meq | EXTENDED_RELEASE_TABLET | Freq: Every day | ORAL | Status: AC

## 2014-04-05 MED ORDER — OMEPRAZOLE 20 MG PO CPDR: 20.0000 mg | DELAYED_RELEASE_CAPSULE | Freq: Every day | ORAL | Status: DC

## 2014-04-05 MED ORDER — LISINOPRIL 10 MG PO TABS: 10.0000 mg | ORAL_TABLET | Freq: Every day | ORAL | Status: DC

## 2014-04-05 NOTE — Progress Notes (Signed)
MRN: [id number] Name: STROTHER EVERITT  Sex: male Age: 34 y.o. DOB: [birth year]  Allergies: Review of patient's allergies indicates no known allergies.  Chief Complaint  Patient presents with  . Follow-up    HPI: Patient is 34 y.o. male who history of chronic CHF, hypertension, GERD, hyperlipidemia comes today for followup and is requesting refill on his medications, he also followed up with the cardiologist a few months ago and is due for another appointment, currently denies any chest pain or shortness of breath, he recently went to the emergency room with symptoms of gastritis, EMR reviewed patient is currently taking Prilosec and Zantac each bedtime as per patient it helps him with the symptoms.  Past Medical History  Diagnosis Date  . Migraine   . Hypertension   . Renal disorder   . NSTEMI (non-ST elevated myocardial infarction) 05/21/2013  . CAD, multiple vessel 05/21/2013    RCA & LCx 100%, 90% large RI, 50-70% segmental Prox LAD, 90% D1; EF 35-40% (basal-mid Inf Akinesis & severe HK of mid-anterolateral wall on Echo)  . 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 Ischemic cardiomyopathy 05/21/2013    Past Surgical History  Procedure Laterality Date  . Coronary artery bypass graft N/A 05/25/2013    Procedure: CORONARY ARTERY BYPASS GRAFTING (CABG);  Surgeon: Alleen Borne, MD;  Location: [facility] OR;  Service: Open Heart Surgery;  Laterality: N/A;  . Intraoperative transesophageal echocardiogram N/A 05/25/2013    Procedure: INTRAOPERATIVE TRANSESOPHAGEAL ECHOCARDIOGRAM;  Surgeon: Alleen Borne, MD;  Location: [facility] OR;  Service: Open Heart Surgery;  Laterality: N/A;  . Endovein harvest of greater saphenous vein Right 05/25/2013    Procedure: ENDOVEIN HARVEST OF GREATER SAPHENOUS VEIN;  Surgeon: Alleen Borne, MD;  Location: [facility] OR;  Service: Open Heart Surgery;  Laterality: Right;  . Cardiac catheterization  05/21/2013    RCA & LCx 100%, 90% large RI, 50-70% segmental Prox  LAD, 90% D1  . Left heart catheterization with coronary angiogram N/A 05/21/2013    Procedure: LEFT HEART CATHETERIZATION WITH CORONARY ANGIOGRAM;  Surgeon: Peter M Swaziland, MD;  Location: [facility] CATH LAB;  Service: Cardiovascular;  Laterality: N/A;      Medication List       This list is accurate as of: 04/05/14 10:26 AM.  Always use your most recent med list.               aspirin 325 MG EC tablet  Take 1 tablet (325 mg total) by mouth daily.     furosemide 40 MG tablet  Commonly known as:  LASIX  Take 1 tablet (40 mg total) by mouth daily. X 7 DAYS  STARTING 06/07/12     lisinopril 10 MG tablet  Commonly known as:  PRINIVIL,ZESTRIL  Take 1 tablet (10 mg total) by mouth daily.     metoprolol tartrate 25 MG tablet  Commonly known as:  LOPRESSOR  Take 1 tablet (25 mg total) by mouth 2 (two) times daily.     omeprazole 20 MG capsule  Commonly known as:  PRILOSEC  Take 1 capsule (20 mg total) by mouth daily.     potassium chloride 10 MEQ tablet  Commonly known as:  K-DUR,KLOR-CON  Take 2 tablets (20 mEq total) by mouth daily. X 7 DAYS    STARTING 06/07/12     pravastatin 10 MG tablet  Commonly known as:  PRAVACHOL  Take 1 tablet (10 mg total) by mouth daily.  promethazine 25 MG tablet  Commonly known as:  PHENERGAN  Take 1 tablet (25 mg total) by mouth every 6 (six) hours as needed for nausea or vomiting.     ranitidine 150 MG tablet  Commonly known as:  ZANTAC  Take 1 tablet (150 mg total) by mouth 2 (two) times daily.        Meds ordered this encounter  Medications  . pravastatin (PRAVACHOL) 10 MG tablet    Sig: Take 1 tablet (10 mg total) by mouth daily.    Dispense:  30 tablet    Refill:  3  . potassium chloride (K-DUR,KLOR-CON) 10 MEQ tablet    Sig: Take 2 tablets (20 mEq total) by mouth daily. X 7 DAYS    STARTING 06/07/12    Dispense:  30 tablet    Refill:  3  . omeprazole (PRILOSEC) 20 MG capsule    Sig: Take 1 capsule (20 mg total) by mouth daily.     Dispense:  30 capsule    Refill:  3  . metoprolol tartrate (LOPRESSOR) 25 MG tablet    Sig: Take 1 tablet (25 mg total) by mouth 2 (two) times daily.    Dispense:  60 tablet    Refill:  3  . lisinopril (PRINIVIL,ZESTRIL) 10 MG tablet    Sig: Take 1 tablet (10 mg total) by mouth daily.    Dispense:  30 tablet    Refill:  3    Immunization History  Administered Date(s) Administered  . Influenza,inj,Quad PF,36+ Mos 12/15/2013    Family History  Problem Relation Age of Onset  . Heart disease Father   . Diabetes Mother   . Heart disease Maternal Grandmother   . Stroke Maternal Grandmother     History  Substance Use Topics  . Smoking status: Former Smoker -- 0.75 packs/day    Types: Cigarettes    Quit date: 05/21/2013  . Smokeless tobacco: Never Used  . Alcohol Use: No    Review of Systems   As noted in HPI  Filed Vitals:   04/05/14 1004  BP: 126/75  Pulse: 67  Temp: 98 F (36.7 C)  Resp: 16    Physical Exam  Physical Exam  Constitutional: No distress.  Eyes: EOM are normal. Pupils are equal, round, and reactive to light.  Cardiovascular: Normal rate and regular rhythm.   Pulmonary/Chest: Breath sounds normal. No respiratory distress. He has no wheezes.  Abdominal: Soft. There is no tenderness. There is no rebound.    CBC    Component Value Date/Time   WBC 6.4 12/21/2013 1431   RBC 5.78 12/21/2013 1431   HGB 16.9 12/21/2013 1431   HCT 48.9 12/21/2013 1431   PLT 291 12/21/2013 1431   MCV 84.6 12/21/2013 1431   LYMPHSABS 1.7 12/21/2013 1431   MONOABS 0.3 12/21/2013 1431   EOSABS 0.1 12/21/2013 1431   BASOSABS 0.0 12/21/2013 1431    CMP     Component Value Date/Time   NA 138 12/21/2013 1431   K 5.1 12/21/2013 1431   CL 101 12/21/2013 1431   CO2 24 12/21/2013 1431   GLUCOSE 111* 12/21/2013 1431   BUN 11 12/21/2013 1431   CREATININE 1.07 12/21/2013 1431   CREATININE 0.95 06/24/2013 0958   CALCIUM 10.3 12/21/2013 1431   PROT 8.5* 12/21/2013  1431   ALBUMIN 4.6 12/21/2013 1431   AST 23 12/21/2013 1431   ALT 22 12/21/2013 1431   ALKPHOS 73 12/21/2013 1431   BILITOT 0.9 12/21/2013 1431  GFRNONAA 88* 12/21/2013 1431   GFRNONAA >89 06/24/2013 0958   GFRAA >90 12/21/2013 1431   GFRAA >89 06/24/2013 0958    Lab Results  Component Value Date/Time   CHOL 110 06/24/2013 09:58 AM    No components found for: HGA1C  Lab Results  Component Value Date/Time   AST 23 12/21/2013 02:31 PM    Assessment and Plan  Chronic systolic heart failure - Plan: currently patient is on beta blockers, statins,Lasix, ACE inhibitor, aspirin, symptoms are well controlled he is following up with his cardiologist, will repeat her lipid panel and blood chemistry pravastatin (PRAVACHOL) 10 MG tablet, metoprolol tartrate (LOPRESSOR) 25 MG tablet, lisinopril (PRINIVIL,ZESTRIL) 10 MG tablet, Lipid panel  Hypokalemia - Plan: Currently patient is on Lasix and potassium chloride (K-DUR,KLOR-CON) 10 MEQ tablet, will repeat blood chemistryCOMPLETE METABOLIC PANEL WITH GFR  Essential hypertension, benign - Plan: blood pressure is well controlled, continue with current meds COMPLETE METABOLIC PANEL WITH GFR  Gastroesophageal reflux disease, esophagitis presence not specified - Plan: advised patient for lifestyle modification, continue with omeprazole (PRILOSEC) 20 MG capsule and Zantac.   Health Maintenance  -Vaccinations:  uptodate with flu shot   Return in about 3 months (around 07/05/2014) for hypertension.  Doris Cheadle, MD

## 2014-04-05 NOTE — Progress Notes (Signed)
Patient here for follow up on his hypertension and medication refill 

## 2014-04-06 ENCOUNTER — Ambulatory Visit: Payer: BLUE CROSS/BLUE SHIELD | Attending: Cardiology | Admitting: Cardiology

## 2014-04-06 ENCOUNTER — Encounter: Payer: Self-pay | Admitting: Cardiology

## 2014-04-06 VITALS — BP 119/81 | HR 79 | Temp 98.4°F | Resp 20 | Ht 68.0 in | Wt 233.0 lb

## 2014-04-06 DIAGNOSIS — I5022 Chronic systolic (congestive) heart failure: Secondary | ICD-10-CM | POA: Insufficient documentation

## 2014-04-06 DIAGNOSIS — I2510 Atherosclerotic heart disease of native coronary artery without angina pectoris: Secondary | ICD-10-CM

## 2014-04-06 DIAGNOSIS — Z951 Presence of aortocoronary bypass graft: Secondary | ICD-10-CM | POA: Insufficient documentation

## 2014-04-06 DIAGNOSIS — I255 Ischemic cardiomyopathy: Secondary | ICD-10-CM | POA: Insufficient documentation

## 2014-04-06 DIAGNOSIS — I10 Essential (primary) hypertension: Secondary | ICD-10-CM

## 2014-04-06 DIAGNOSIS — E785 Hyperlipidemia, unspecified: Secondary | ICD-10-CM | POA: Diagnosis not present

## 2014-04-06 MED ORDER — PRAVASTATIN SODIUM 20 MG PO TABS: 20.0000 mg | ORAL_TABLET | Freq: Every day | ORAL | Status: DC

## 2014-04-06 MED ORDER — FUROSEMIDE 40 MG PO TABS: 40.0000 mg | ORAL_TABLET | Freq: Every day | ORAL | Status: DC

## 2014-04-06 MED ORDER — ASPIRIN 325 MG PO TBEC: 325.0000 mg | DELAYED_RELEASE_TABLET | Freq: Every day | ORAL | Status: AC

## 2014-04-06 NOTE — Assessment & Plan Note (Signed)
Stable. Continue secondary preventative therapy including beta blocker, ACE inhibitor, aspirin, and statin. Patient encouraged to lose weight and exercise 3 times a week.

## 2014-04-06 NOTE — Assessment & Plan Note (Signed)
Stable clinically. Continue current medications including beta blocker, ACE inhibitor and diuretic. Will check outpatient echocardiogram to reassess left ventricular function. Hopefully we'll see some improvement. Follow-up in 6 months. Patient encouraged to lose weight and to do cardio activity and toning weight lifting 3 times a week.

## 2014-04-06 NOTE — Progress Notes (Signed)
Patient with history of chronic systolic heart failure, ischemic cardiomyopathy, hyperlipidemia, CAD s/p CABG 3/15 Patient denies chest pain, shortness of breath, dizziness. He indicates he has occasional headaches and still has swelling in right leg and foot. Patient indicates he smokes marijuana daily for stress relief.

## 2014-04-06 NOTE — Patient Instructions (Addendum)
It was great seeing you again today. Your aspirin and lasix have been refilled. Pravastatin has been increased to 20 mg. All prescriptions were sent to [facility] on [address]. You will need to return in 3 months for fasting labwork since Pravastatin was increased.  Dr. Daleen Squibb would like you to have an echo done. It has been scheduled for 04/07/14 at 1000. Please go to Entrance A, Admitting for test. We will call you with your echo results. Please return to see Dr. Daleen Squibb again in 6 months.

## 2014-04-06 NOTE — Assessment & Plan Note (Signed)
LDL at 116. Will increase pravastatin to 20 mg by mouth daily at bedtime.

## 2014-04-06 NOTE — Assessment & Plan Note (Signed)
Same plan is under coronary artery disease.

## 2014-04-06 NOTE — Progress Notes (Signed)
Ricardo [name] returns today for follow-up of his history of chronic systolic heart failure, ischemic cardiomyopathy, history of coronary artery bypass grafting and hyperlipidemia.  He has no complaints except that he has gained about 12 pounds. He is intending on starting a regular exercise program. He denies any angina or ischemic symptoms. He still has some residual swelling in his right leg particularly in the day. This was a leg where the vein grafts were harvested.  His most recent LDL was 116 on low-dose pravastatin. Other labs looked unremarkable.  Exam today shows no acute distress. His vital signs are stable. HEENT is unremarkable. Neck shows no JVD. Carotid strokes are equal bilaterally without bruits. No thyroid enlargement. Chest reveals a well-healed median sternotomy scar. He has a nondisplaced PMI normal S1-S2 with no gallop. Lungs were clear to auscultation percussion. Abdominal exam is soft with no midline bruit extremities were no cyanosis clubbing and only trace pitting edema pretibially on the right. Her exam is grossly intact. His affect is normal.

## 2014-04-07 ENCOUNTER — Ambulatory Visit (HOSPITAL_COMMUNITY)
Admission: RE | Admit: 2014-04-07 | Discharge: 2014-04-07 | Disposition: A | Discharge status: Home / Self Care | Payer: BLUE CROSS/BLUE SHIELD | Source: Ambulatory Visit | Attending: Cardiology | Admitting: Cardiology

## 2014-04-07 DIAGNOSIS — I5022 Chronic systolic (congestive) heart failure: Secondary | ICD-10-CM | POA: Diagnosis present

## 2014-04-07 DIAGNOSIS — I379 Nonrheumatic pulmonary valve disorder, unspecified: Secondary | ICD-10-CM

## 2014-04-07 NOTE — Progress Notes (Signed)
Echocardiogram 2D Echocardiogram has been performed.  Dorothey Baseman 04/07/2014, 10:59 AM

## 2014-04-08 ENCOUNTER — Telehealth: Payer: Self-pay

## 2014-04-08 NOTE — Telephone Encounter (Signed)
Patient is aware of his lab results 

## 2014-04-08 NOTE — Telephone Encounter (Signed)
-----   Message from Doris Cheadle, MD sent at 04/06/2014  9:29 AM EST ----- Blood work reviewed, call and let the patient know that his potassium level is in normal range. Also noticed his bad cholesterol LDL is >100 , since patient has history of heart disease, would recommend increasing  the dose of Lipitor to 20 mg daily, will repeat fasting lipid panel on the next visit.

## 2014-04-13 ENCOUNTER — Telehealth: Payer: Self-pay

## 2014-04-13 NOTE — Telephone Encounter (Signed)
Patient give echocardiogram results and instructed to continue on current medication regimen.  Patient aware he will need to follow-up with Dr. Daleen Squibb in six months. Patient to call back at a later time to schedule appointment.

## 2014-04-13 NOTE — Telephone Encounter (Signed)
-----   Message from Gaylord Shih, MD sent at 04/13/2014  3:54 PM EST ----- Echocardiogram is stable. Continue current medications. Follow-up is scheduled. Please make sure.

## 2014-06-02 ENCOUNTER — Encounter (HOSPITAL_BASED_OUTPATIENT_CLINIC_OR_DEPARTMENT_OTHER): Payer: Self-pay | Admitting: *Deleted

## 2014-06-02 ENCOUNTER — Emergency Department (HOSPITAL_BASED_OUTPATIENT_CLINIC_OR_DEPARTMENT_OTHER): Payer: BLUE CROSS/BLUE SHIELD

## 2014-06-02 ENCOUNTER — Emergency Department (HOSPITAL_BASED_OUTPATIENT_CLINIC_OR_DEPARTMENT_OTHER)
Admission: EM | Admit: 2014-06-02 | Discharge: 2014-06-02 | Disposition: A | Discharge status: Home / Self Care | Payer: BLUE CROSS/BLUE SHIELD | Attending: Emergency Medicine | Admitting: Emergency Medicine

## 2014-06-02 DIAGNOSIS — Z7982 Long term (current) use of aspirin: Secondary | ICD-10-CM | POA: Insufficient documentation

## 2014-06-02 DIAGNOSIS — R1010 Upper abdominal pain, unspecified: Secondary | ICD-10-CM

## 2014-06-02 DIAGNOSIS — Z9889 Other specified postprocedural states: Secondary | ICD-10-CM | POA: Insufficient documentation

## 2014-06-02 DIAGNOSIS — I2510 Atherosclerotic heart disease of native coronary artery without angina pectoris: Secondary | ICD-10-CM | POA: Insufficient documentation

## 2014-06-02 DIAGNOSIS — Z87448 Personal history of other diseases of urinary system: Secondary | ICD-10-CM | POA: Insufficient documentation

## 2014-06-02 DIAGNOSIS — I10 Essential (primary) hypertension: Secondary | ICD-10-CM | POA: Insufficient documentation

## 2014-06-02 DIAGNOSIS — Z8673 Personal history of transient ischemic attack (TIA), and cerebral infarction without residual deficits: Secondary | ICD-10-CM | POA: Insufficient documentation

## 2014-06-02 DIAGNOSIS — Z951 Presence of aortocoronary bypass graft: Secondary | ICD-10-CM | POA: Insufficient documentation

## 2014-06-02 DIAGNOSIS — K219 Gastro-esophageal reflux disease without esophagitis: Secondary | ICD-10-CM

## 2014-06-02 DIAGNOSIS — I252 Old myocardial infarction: Secondary | ICD-10-CM | POA: Insufficient documentation

## 2014-06-02 DIAGNOSIS — Z87891 Personal history of nicotine dependence: Secondary | ICD-10-CM | POA: Insufficient documentation

## 2014-06-02 DIAGNOSIS — Z79899 Other long term (current) drug therapy: Secondary | ICD-10-CM | POA: Insufficient documentation

## 2014-06-02 DIAGNOSIS — F1210 Cannabis abuse, uncomplicated: Secondary | ICD-10-CM | POA: Insufficient documentation

## 2014-06-02 LAB — CBC WITH DIFFERENTIAL/PLATELET
BASOS ABS: 0 10*3/uL (ref 0.0–0.1)
Basophils Relative: 0 % (ref 0–1)
EOS PCT: 0 % (ref 0–5)
Eosinophils Absolute: 0 10*3/uL (ref 0.0–0.7)
HEMATOCRIT: 47.9 % (ref 39.0–52.0)
Hemoglobin: 16.7 g/dL (ref 13.0–17.0)
LYMPHS ABS: 1 10*3/uL (ref 0.7–4.0)
Lymphocytes Relative: 13 % (ref 12–46)
MCH: 29.3 pg (ref 26.0–34.0)
MCHC: 34.9 g/dL (ref 30.0–36.0)
MCV: 84.2 fL (ref 78.0–100.0)
MONOS PCT: 4 % (ref 3–12)
Monocytes Absolute: 0.3 10*3/uL (ref 0.1–1.0)
Neutro Abs: 6.6 10*3/uL (ref 1.7–7.7)
Neutrophils Relative %: 83 % — ABNORMAL HIGH (ref 43–77)
PLATELETS: 272 10*3/uL (ref 150–400)
RBC: 5.69 MIL/uL (ref 4.22–5.81)
RDW: 14 % (ref 11.5–15.5)
WBC: 8 10*3/uL (ref 4.0–10.5)

## 2014-06-02 LAB — COMPREHENSIVE METABOLIC PANEL
ALK PHOS: 76 U/L (ref 39–117)
ALT: 24 U/L (ref 0–53)
AST: 29 U/L (ref 0–37)
Albumin: 5.1 g/dL (ref 3.5–5.2)
Anion gap: 9 (ref 5–15)
BUN: 15 mg/dL (ref 6–23)
CHLORIDE: 102 mmol/L (ref 96–112)
CO2: 27 mmol/L (ref 19–32)
Calcium: 9.9 mg/dL (ref 8.4–10.5)
Creatinine, Ser: 1.02 mg/dL (ref 0.50–1.35)
GFR calc Af Amer: >90 mL/min (ref >90)
GFR calc non Af Amer: >90 mL/min (ref >90)
GLUCOSE: 114 mg/dL — AB (ref 70–99)
POTASSIUM: 4 mmol/L (ref 3.5–5.1)
SODIUM: 138 mmol/L (ref 135–145)
Total Bilirubin: 0.9 mg/dL (ref 0.3–1.2)
Total Protein: 8.2 g/dL (ref 6.0–8.3)

## 2014-06-02 LAB — URINALYSIS, ROUTINE W REFLEX MICROSCOPIC
Bilirubin Urine: NEGATIVE
Glucose, UA: NEGATIVE mg/dL
HGB URINE DIPSTICK: NEGATIVE
KETONES UR: 15 mg/dL — AB
Leukocytes, UA: NEGATIVE
Nitrite: NEGATIVE
PROTEIN: NEGATIVE mg/dL
Specific Gravity, Urine: 1.023 (ref 1.005–1.030)
UROBILINOGEN UA: 0.2 mg/dL (ref 0.0–1.0)
pH: 5.5 (ref 5.0–8.0)

## 2014-06-02 LAB — LIPASE, BLOOD: LIPASE: 25 U/L (ref 11–59)

## 2014-06-02 LAB — RAPID URINE DRUG SCREEN, HOSP PERFORMED
Amphetamines: NOT DETECTED
BENZODIAZEPINES: NOT DETECTED
Barbiturates: NOT DETECTED
Cocaine: NOT DETECTED
Opiates: NOT DETECTED
Tetrahydrocannabinol: POSITIVE — AB

## 2014-06-02 LAB — ETHANOL: Alcohol, Ethyl (B): <5 mg/dL (ref 0–9)

## 2014-06-02 LAB — TROPONIN I
Troponin I: <0.03 ng/mL (ref <0.031)
Troponin I: <0.03 ng/mL (ref <0.031)

## 2014-06-02 MED ORDER — MORPHINE SULFATE 4 MG/ML IJ SOLN
4.0000 mg | Freq: Once | INTRAMUSCULAR | Status: AC
  Administered 2014-06-02: 4 mg via INTRAVENOUS
  Filled 2014-06-02: qty 1

## 2014-06-02 MED ORDER — ONDANSETRON HCL 4 MG/2ML IJ SOLN
4.0000 mg | Freq: Once | INTRAMUSCULAR | Status: AC
  Administered 2014-06-02: 4 mg via INTRAVENOUS
  Filled 2014-06-02: qty 2

## 2014-06-02 MED ORDER — SODIUM CHLORIDE 0.9 % IV BOLUS (SEPSIS)
1000.0000 mL | Freq: Once | INTRAVENOUS | Status: AC
  Administered 2014-06-02: 1000 mL via INTRAVENOUS

## 2014-06-02 NOTE — ED Notes (Signed)
RN Misty given fluid for the challenge

## 2014-06-02 NOTE — ED Notes (Signed)
Pt c/o epigastric pain that began this morning with N/V/D.

## 2014-06-02 NOTE — ED Notes (Signed)
Pt. Going to [place] and is reporting no pain at this time.

## 2014-06-02 NOTE — Discharge Instructions (Signed)
Please call your doctor for a followup appointment within 24-48 hours. When you talk to your doctor please let them know that you were seen in the emergency department and have them acquire all of your records so that they can discuss the findings with you and formulate a treatment plan to fully care for your new and ongoing problems. Please follow-up with primary care provider Please follow-up with cardiologist Please follow-up with gastroenterology Please continue to take Zantac as prescribed Please drink plenty of water and stay hydrated Please avoid foods that are spicy, greasy, oily, fried, ascitic such as ketchup/lime/lemon/citric fruits Please avoid eating late at night, please eat approximate 2-3 hours prior to laying down Please continue to monitor symptoms closely and if symptoms are to worsen or change (fever greater than 101, chills, sweating, nausea, vomiting, chest pain, shortness of breathe, difficulty breathing, weakness, numbness, tingling, worsening or changes to pain pattern, coughing up blood, blood in the stools, black tarry stools, vomiting of blood, fainting, dizziness) please report back to the Emergency Department immediately.   Gastroesophageal Reflux Disease, Adult Gastroesophageal reflux disease (GERD) happens when acid from your stomach flows up into the esophagus. When acid comes in contact with the esophagus, the acid causes soreness (inflammation) in the esophagus. Over time, GERD may create small holes (ulcers) in the lining of the esophagus. CAUSES   Increased body weight. This puts pressure on the stomach, making acid rise from the stomach into the esophagus.  Smoking. This increases acid production in the stomach.  Drinking alcohol. This causes decreased pressure in the lower esophageal sphincter (valve or ring of muscle between the esophagus and stomach), allowing acid from the stomach into the esophagus.  Late evening meals and a full stomach. This increases  pressure and acid production in the stomach.  A malformed lower esophageal sphincter. Sometimes, no cause is found. SYMPTOMS   Burning pain in the lower part of the mid-chest behind the breastbone and in the mid-stomach area. This may occur twice a week or more often.  Trouble swallowing.  Sore throat.  Dry cough.  Asthma-like symptoms including chest tightness, shortness of breath, or wheezing. DIAGNOSIS  Your caregiver may be able to diagnose GERD based on your symptoms. In some cases, X-rays and other tests may be done to check for complications or to check the condition of your stomach and esophagus. TREATMENT  Your caregiver may recommend over-the-counter or prescription medicines to help decrease acid production. Ask your caregiver before starting or adding any new medicines.  HOME CARE INSTRUCTIONS   Change the factors that you can control. Ask your caregiver for guidance concerning weight loss, quitting smoking, and alcohol consumption.  Avoid foods and drinks that make your symptoms worse, such as:  Caffeine or alcoholic drinks.  Chocolate.  Peppermint or mint flavorings.  Garlic and onions.  Spicy foods.  Citrus fruits, such as oranges, lemons, or limes.  Tomato-based foods such as sauce, chili, salsa, and pizza.  Fried and fatty foods.  Avoid lying down for the 3 hours prior to your bedtime or prior to taking a nap.  Eat small, frequent meals instead of large meals.  Wear loose-fitting clothing. Do not wear anything tight around your waist that causes pressure on your stomach.  Raise the head of your bed 6 to 8 inches with wood blocks to help you sleep. Extra pillows will not help.  Only take over-the-counter or prescription medicines for pain, discomfort, or fever as directed by your caregiver.  Do not  take aspirin, ibuprofen, or other nonsteroidal anti-inflammatory drugs (NSAIDs). SEEK IMMEDIATE MEDICAL CARE IF:   You have pain in your arms, neck,  jaw, teeth, or back.  Your pain increases or changes in intensity or duration.  You develop nausea, vomiting, or sweating (diaphoresis).  You develop shortness of breath, or you faint.  Your vomit is green, yellow, black, or looks like coffee grounds or blood.  Your stool is red, bloody, or black. These symptoms could be signs of other problems, such as heart disease, gastric bleeding, or esophageal bleeding. MAKE SURE YOU:   Understand these instructions.  Will watch your condition.  Will get help right away if you are not doing well or get worse. Document Released: 12/19/2004 Document Revised: 06/03/2011 Document Reviewed: 09/28/2010 [facility] Patient Information 2015 [place], [place]. This information is not intended to replace advice given to you by your health care provider. Make sure you discuss any questions you have with your health care provider. Food Choices for Gastroesophageal Reflux Disease When you have gastroesophageal reflux disease (GERD), the foods you eat and your eating habits are very important. Choosing the right foods can help ease the discomfort of GERD. WHAT GENERAL GUIDELINES DO I NEED TO FOLLOW?  Choose fruits, vegetables, whole grains, low-fat dairy products, and low-fat meat, fish, and poultry.  Limit fats such as oils, salad dressings, butter, nuts, and avocado.  Keep a food diary to identify foods that cause symptoms.  Avoid foods that cause reflux. These may be different for different people.  Eat frequent small meals instead of three large meals each day.  Eat your meals slowly, in a relaxed setting.  Limit fried foods.  Cook foods using methods other than frying.  Avoid drinking alcohol.  Avoid drinking large amounts of liquids with your meals.  Avoid bending over or lying down until 2-3 hours after eating. WHAT FOODS ARE NOT RECOMMENDED? The following are some foods and drinks that may worsen your symptoms: Vegetables Tomatoes. Tomato  juice. Tomato and spaghetti sauce. Chili peppers. Onion and garlic. Horseradish. Fruits Oranges, grapefruit, and lemon (fruit and juice). Meats High-fat meats, fish, and poultry. This includes hot dogs, ribs, ham, sausage, salami, and bacon. Dairy Whole milk and chocolate milk. Sour cream. Cream. Butter. Ice cream. Cream cheese.  Beverages Coffee and tea, with or without caffeine. Carbonated beverages or energy drinks. Condiments Hot sauce. Barbecue sauce.  Sweets/Desserts Chocolate and cocoa. Donuts. Peppermint and spearmint. Fats and Oils High-fat foods, including [place] fries and potato chips. Other Vinegar. Strong spices, such as black pepper, white pepper, red pepper, cayenne, curry powder, cloves, ginger, and chili powder. The items listed above may not be a complete list of foods and beverages to avoid. Contact your dietitian for more information. Document Released: 03/11/2005 Document Revised: 03/16/2013 Document Reviewed: 01/13/2013 [facility] Patient Information 2015 [place], [place]. This information is not intended to replace advice given to you by your health care provider. Make sure you discuss any questions you have with your health care provider.

## 2014-06-02 NOTE — ED Provider Notes (Signed)
CSN: [id number]     Arrival date & time 06/02/14  1708 History   First MD Initiated Contact with Patient 06/02/14 1741     Chief Complaint  Patient presents with  . Abdominal Pain     (Consider location/radiation/quality/duration/timing/severity/associated sxs/prior Treatment) The history is provided by the patient. No language interpreter was used.  Ricardo [name] is a 34 y/o M with PMHx of migraine, hypertension, renal disorder, NSTEMI in 2015 status post CABG 4 in March 2015 presenting to the emergency department with upper abdominal pain that started this morning localized epigastric region that is described as a pressure sensation that is constant without radiation. Patient reported that he's been having intermittent nausea-we've he comes over him with associated nausea and an episode of emesis. Reported that he has had at least 8 episodes of emesis-NB/NB mainly of stomach contents. Reported at least 4 episodes of diarrhea-loose stools that are nonbloody and non-mucousy. Stated that he's been taking his ocular medications. Reported that he felt fine yesterday but he did eat after midnight last night, reported that he ate salmon. Stated that he's been dealing with this abdominal pain for years but has never been seen by physician or gastroenterologist regarding it. Denied chronic NSAID use or alcohol abuse. Denied fever, neck pain, neck stiffness, blurred vision, sudden loss of vision, chest pain, shortness of breath, difficulty breathing, hematuria, dysuria, melena, hematochezia, travels. PCP Dr. Orpah Cobb  Past Medical History  Diagnosis Date  . Migraine   . Hypertension   . Renal disorder   . NSTEMI (non-ST elevated myocardial infarction) 05/21/2013  . CAD, multiple vessel 05/21/2013    RCA & LCx 100%, 90% large RI, 50-70% segmental Prox LAD, 90% D1; EF 35-40% (basal-mid Inf Akinesis & severe HK of mid-anterolateral wall on Echo)  . 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 Ischemic cardiomyopathy 05/21/2013   Past Surgical History  Procedure Laterality Date  . Coronary artery bypass graft N/A 05/25/2013    Procedure: CORONARY ARTERY BYPASS GRAFTING (CABG);  Surgeon: Alleen Borne, MD;  Location: [facility] OR;  Service: Open Heart Surgery;  Laterality: N/A;  . Intraoperative transesophageal echocardiogram N/A 05/25/2013    Procedure: INTRAOPERATIVE TRANSESOPHAGEAL ECHOCARDIOGRAM;  Surgeon: Alleen Borne, MD;  Location: [facility] OR;  Service: Open Heart Surgery;  Laterality: N/A;  . Endovein harvest of greater saphenous vein Right 05/25/2013    Procedure: ENDOVEIN HARVEST OF GREATER SAPHENOUS VEIN;  Surgeon: Alleen Borne, MD;  Location: [facility] OR;  Service: Open Heart Surgery;  Laterality: Right;  . Cardiac catheterization  05/21/2013    RCA & LCx 100%, 90% large RI, 50-70% segmental Prox LAD, 90% D1  . Left heart catheterization with coronary angiogram N/A 05/21/2013    Procedure: LEFT HEART CATHETERIZATION WITH CORONARY ANGIOGRAM;  Surgeon: Peter M Swaziland, MD;  Location: [facility] CATH LAB;  Service: Cardiovascular;  Laterality: N/A;   Family History  Problem Relation Age of Onset  . Heart disease Father   . Diabetes Mother   . Heart disease Maternal Grandmother   . Stroke Maternal Grandmother    History  Substance Use Topics  . Smoking status: Former Smoker -- 0.75 packs/day    Types: Cigarettes    Quit date: 05/21/2013  . Smokeless tobacco: Never Used  . Alcohol Use: No    Review of Systems  Constitutional: Negative for fever and chills.  Eyes: Negative for visual disturbance.  Respiratory: Negative for chest tightness and shortness of breath.  Cardiovascular: Negative for chest pain.  Gastrointestinal: Positive for nausea, vomiting, abdominal pain and diarrhea. Negative for constipation, blood in stool and anal bleeding.  Genitourinary: Negative for dysuria, hematuria and decreased urine volume.  Musculoskeletal: Negative for back pain, neck pain and neck stiffness.   Neurological: Negative for dizziness, syncope, weakness and headaches.      Allergies  Review of patient's allergies indicates no known allergies.  Home Medications   Prior to Admission medications   Medication Sig Start Date End Date Taking? Authorizing Provider  aspirin 325 MG EC tablet Take 1 tablet (325 mg total) by mouth daily. 04/06/14  Yes Gaylord Shih, MD  furosemide (LASIX) 40 MG tablet Take 1 tablet (40 mg total) by mouth daily. X 7 DAYS  STARTING 06/07/12 04/06/14  Yes Gaylord Shih, MD  lisinopril (PRINIVIL,ZESTRIL) 10 MG tablet Take 1 tablet (10 mg total) by mouth daily. 04/05/14  Yes Doris Cheadle, MD  metoprolol tartrate (LOPRESSOR) 25 MG tablet Take 1 tablet (25 mg total) by mouth 2 (two) times daily. 04/05/14  Yes Doris Cheadle, MD  pravastatin (PRAVACHOL) 20 MG tablet Take 1 tablet (20 mg total) by mouth daily. 04/06/14  Yes Gaylord Shih, MD  promethazine (PHENERGAN) 25 MG tablet Take 1 tablet (25 mg total) by mouth every 6 (six) hours as needed for nausea or vomiting. 12/02/13  Yes Junius Finner, PA-C  ranitidine (ZANTAC) 150 MG tablet Take 1 tablet (150 mg total) by mouth 2 (two) times daily. 12/21/13  Yes Pricilla Loveless, MD  omeprazole (PRILOSEC) 20 MG capsule Take 1 capsule (20 mg total) by mouth daily. Patient not taking: Reported on 04/06/2014 04/05/14   Doris Cheadle, MD  potassium chloride (K-DUR,KLOR-CON) 10 MEQ tablet Take 2 tablets (20 mEq total) by mouth daily. X 7 DAYS    STARTING 06/07/12 04/05/14   Doris Cheadle, MD   BP 135/85 mmHg  Pulse 70  Temp(Src) 97.9 F (36.6 C) (Oral)  Resp 16  Ht  (1.702 m)  Wt 225 lb (102.059 kg)  BMI 35.23 kg/m2  SpO2 99% Physical Exam  Constitutional: He is oriented to person, place, and time. He appears well-developed and well-nourished. No distress.  HENT:  Head: Normocephalic and atraumatic.  Mouth/Throat: Oropharynx is clear and moist. No oropharyngeal exudate.  Eyes: Conjunctivae and EOM are normal. Pupils are  equal, round, and reactive to light. Right eye exhibits no discharge. Left eye exhibits no discharge.  Neck: Normal range of motion. Neck supple.  Cardiovascular: Normal rate, regular rhythm and normal heart sounds.   Pulmonary/Chest: Effort normal and breath sounds normal. No respiratory distress. He has no wheezes. He has no rales. He exhibits no tenderness.  Abdominal: Soft. Bowel sounds are normal. He exhibits no distension. There is tenderness in the epigastric area. There is no rebound, no guarding and negative Murphy's sign.  Musculoskeletal: Normal range of motion.  Neurological: He is alert and oriented to person, place, and time. No cranial nerve deficit. He exhibits normal muscle tone. Coordination normal.  Skin: Skin is warm and dry. No rash noted. He is not diaphoretic. No erythema.  Psychiatric: He has a normal mood and affect. His behavior is normal. Thought content normal.  Nursing note and vitals reviewed.   ED Course  Procedures (including critical care time)  Results for orders placed or performed during the hospital encounter of 06/02/14  CBC with Differential/Platelet  Result Value Ref Range   WBC 8.0 4.0 - 10.5 K/uL   RBC 5.69 4.22 -  5.81 MIL/uL   Hemoglobin 16.7 13.0 - 17.0 g/dL   HCT 16.1 09.6 - 04.5 %   MCV 84.2 78.0 - 100.0 fL   MCH 29.3 26.0 - 34.0 pg   MCHC 34.9 30.0 - 36.0 g/dL   RDW 40.9 81.1 - 91.4 %   Platelets 272 150 - 400 K/uL   Neutrophils Relative % 83 (H) 43 - 77 %   Neutro Abs 6.6 1.7 - 7.7 K/uL   Lymphocytes Relative 13 12 - 46 %   Lymphs Abs 1.0 0.7 - 4.0 K/uL   Monocytes Relative 4 3 - 12 %   Monocytes Absolute 0.3 0.1 - 1.0 K/uL   Eosinophils Relative 0 0 - 5 %   Eosinophils Absolute 0.0 0.0 - 0.7 K/uL   Basophils Relative 0 0 - 1 %   Basophils Absolute 0.0 0.0 - 0.1 K/uL  Comprehensive metabolic panel  Result Value Ref Range   Sodium 138 135 - 145 mmol/L   Potassium 4.0 3.5 - 5.1 mmol/L   Chloride 102 96 - 112 mmol/L   CO2 27 19 -  32 mmol/L   Glucose, Bld 114 (H) 70 - 99 mg/dL   BUN 15 6 - 23 mg/dL   Creatinine, Ser 7.82 0.50 - 1.35 mg/dL   Calcium 9.9 8.4 - 95.6 mg/dL   Total Protein 8.2 6.0 - 8.3 g/dL   Albumin 5.1 3.5 - 5.2 g/dL   AST 29 0 - 37 U/L   ALT 24 0 - 53 U/L   Alkaline Phosphatase 76 39 - 117 U/L   Total Bilirubin 0.9 0.3 - 1.2 mg/dL   GFR calc non Af Amer >90 >90 mL/min   GFR calc Af Amer >90 >90 mL/min   Anion gap 9 5 - 15  Lipase, blood  Result Value Ref Range   Lipase 25 11 - 59 U/L  Troponin I  Result Value Ref Range   Troponin I <0.03 <0.031 ng/mL  Urinalysis, Routine w reflex microscopic  Result Value Ref Range   Color, Urine YELLOW YELLOW   APPearance CLEAR CLEAR   Specific Gravity, Urine 1.023 1.005 - 1.030   pH 5.5 5.0 - 8.0   Glucose, UA NEGATIVE NEGATIVE mg/dL   Hgb urine dipstick NEGATIVE NEGATIVE   Bilirubin Urine NEGATIVE NEGATIVE   Ketones, ur 15 (A) NEGATIVE mg/dL   Protein, ur NEGATIVE NEGATIVE mg/dL   Urobilinogen, UA 0.2 0.0 - 1.0 mg/dL   Nitrite NEGATIVE NEGATIVE   Leukocytes, UA NEGATIVE NEGATIVE  Ethanol  Result Value Ref Range   Alcohol, Ethyl (B) <5 0 - 9 mg/dL  Urine rapid drug screen (hosp performed)  Result Value Ref Range   Opiates NONE DETECTED NONE DETECTED   Cocaine NONE DETECTED NONE DETECTED   Benzodiazepines NONE DETECTED NONE DETECTED   Amphetamines NONE DETECTED NONE DETECTED   Tetrahydrocannabinol POSITIVE (A) NONE DETECTED   Barbiturates NONE DETECTED NONE DETECTED  Troponin I  Result Value Ref Range   Troponin I <0.03 <0.031 ng/mL    Labs Review Labs Reviewed  CBC WITH DIFFERENTIAL/PLATELET - Abnormal; Notable for the following:    Neutrophils Relative % 83 (*)    All other components within normal limits  COMPREHENSIVE METABOLIC PANEL - Abnormal; Notable for the following:    Glucose, Bld 114 (*)    All other components within normal limits  URINALYSIS, ROUTINE W REFLEX MICROSCOPIC - Abnormal; Notable for the following:    Ketones,  ur 15 (*)    All other  components within normal limits  URINE RAPID DRUG SCREEN (HOSP PERFORMED) - Abnormal; Notable for the following:    Tetrahydrocannabinol POSITIVE (*)    All other components within normal limits  LIPASE, BLOOD  TROPONIN I  ETHANOL  TROPONIN I    Imaging Review US Abdomen Complete  06/02/2014   CLINICAL DATA:  Epigastric pain, nausea, and vomiting since this morning.  EXAM: ULTRASOUND ABDOMEN COMPLETE  COMPARISON:  CT abdomen and pelvis 12/02/2013  FINDINGS: Gallbladder: No gallstones or wall thickening visualized. No sonographic Murphy sign noted.  Common bile duct: Diameter: 3 mm, normal  Liver: Diffusely increased parenchymal echotexture consistent with diffuse fatty infiltration. No focal lesions identified.  IVC: No abnormality visualized.  Pancreas: Limited visualization due to overlying bowel gas.  Spleen: Size and appearance within normal limits.  Right Kidney: Length: 10.4 cm. Echogenicity within normal limits. No mass or hydronephrosis visualized.  Left Kidney: Length: 11.8 cm. Echogenicity within normal limits. No mass or hydronephrosis visualized.  Abdominal aorta: No aneurysm visualized.  Other findings: None.  IMPRESSION: Diffuse fatty infiltration of the liver. Examination is otherwise normal.   Electronically Signed   By: Burman Nieves M.D.   On: 06/02/2014 21:42     EKG Interpretation   Date/Time:  Thursday June 02 2014 17:21:53 EST Ventricular Rate:  61 PR Interval:  174 QRS Duration: 96 QT Interval:  416 QTC Calculation: 418 R Axis:   -61 Text Interpretation:  Normal sinus rhythm Left anterior fascicular block  Inferior infarct , age undetermined T wave abnormality, consider lateral  ischemia Abnormal ECG Confirmed by ZACKOWSKI  MD, SCOTT [id number]) on  06/02/2014 5:32:11 PM       10:36 PM This provider reassess the patient. Patient reported that he is feeling much better-denied pain. Denied chest pain, shortness of breath, difficulty  breathing, abdominal pain, nausea, vomiting. Patient has been able to keep down ginger ale without difficulty-negative episodes of emesis in ED setting. Patient reported that he does eat a lot of spicy foods as well as tomatoes and ketchup. Discussed with patient that he needs to decrease this spicy food and greasy food intake. Reported that he normally eats and goes right to bed.  MDM   Final diagnoses:  Gastroesophageal reflux disease, esophagitis presence not specified  Upper abdominal pain    Medications  morphine 4 MG/ML injection 4 mg (4 mg Intravenous Given 06/02/14 1830)  ondansetron (ZOFRAN) injection 4 mg (4 mg Intravenous Given 06/02/14 1829)  sodium chloride 0.9 % bolus 1,000 mL (1,000 mLs Intravenous New Bag/Given 06/02/14 1830)    Filed Vitals:   06/02/14 1717 06/02/14 2112 06/02/14 2115  BP: 131/79 135/85 135/85  Pulse: 66 70   Temp: 97.9 F (36.6 C)    TempSrc: Oral    Resp: 18 16   Height:  (1.702 m)    Weight: 225 lb (102.059 kg)    SpO2: 100% 99%    EKG noted normal sinus rhythm with a left anterior fascicular block with a heart rate of 61 bpm. Troponin negative elevation. Second troponin negative elevation. CBC negative elevated white blood cell count. Hemoglobin 16.7, hematocrit 47.9. CMP unremarkable-glucose 114 with negative elevated anion gap. Lipase negative elevation. Ethanol negative elevation. Urinalysis negative hemoglobin, nitrites, leukocytes-negative findings of infection. Urine drug screen positive for cannabis. Abdominal ultrasound unremarkable-diffuse fatty liver infiltration noted. Doubt pancreatitis. Doubt cholecystitis/cholangitis. Negative findings of UTI upon nephritis. Negative findings of kidney stones. Abdominal exam unremarkable-dedicated abdominal processes. Patient given IV fluids and pain medications  in ED setting with positive relief. Patient seen and assessed by attending physician, Dr. Deretha Emory, who agreed to plan of discharge. Patient  has been seen and assessed in ED setting numerous times regarding upper abdominal pain, but has yet to follow-up with gastroenterologist. This appears to be a chronic issue. Patient tolerated fluids by mouth in ED setting. Patient stable, afebrile. Patient not septic appearing. Discharged patient. Referred patient to PCP, cardiology, GI. Discussed with patient to avoid spicy, greasy foods-discussed diet in great detail with patient. Discussed with patient to closely monitor symptoms and if symptoms are to worsen or change to report back to the ED - strict return instructions given.  Patient agreed to plan of care, understood, all questions answered.  Raymon Mutton, PA-C 06/02/14 2250

## 2014-06-02 NOTE — ED Provider Notes (Addendum)
Medical screening examination/treatment/procedure(s) were conducted as a shared visit with non-physician practitioner(s) and myself.  I personally evaluated the patient during the encounter.   EKG Interpretation   Date/Time:  Thursday June 02 2014 17:21:53 EST Ventricular Rate:  61 PR Interval:  174 QRS Duration: 96 QT Interval:  416 QTC Calculation: 418 R Axis:   -61 Text Interpretation:  Normal sinus rhythm Left anterior fascicular block  Inferior infarct , age undetermined T wave abnormality, consider lateral  ischemia Abnormal ECG Confirmed by Vinayak Bobier  MD, Savina Olshefski [phone]) on  06/02/2014 5:32:11 PM     Results for orders placed or performed during the hospital encounter of 06/02/14  CBC with Differential/Platelet  Result Value Ref Range   WBC 8.0 4.0 - 10.5 K/uL   RBC 5.69 4.22 - 5.81 MIL/uL   Hemoglobin 16.7 13.0 - 17.0 g/dL   HCT 00.7 62.2 - 63.3 %   MCV 84.2 78.0 - 100.0 fL   MCH 29.3 26.0 - 34.0 pg   MCHC 34.9 30.0 - 36.0 g/dL   RDW 35.4 56.2 - 56.3 %   Platelets 272 150 - 400 K/uL   Neutrophils Relative % 83 (H) 43 - 77 %   Neutro Abs 6.6 1.7 - 7.7 K/uL   Lymphocytes Relative 13 12 - 46 %   Lymphs Abs 1.0 0.7 - 4.0 K/uL   Monocytes Relative 4 3 - 12 %   Monocytes Absolute 0.3 0.1 - 1.0 K/uL   Eosinophils Relative 0 0 - 5 %   Eosinophils Absolute 0.0 0.0 - 0.7 K/uL   Basophils Relative 0 0 - 1 %   Basophils Absolute 0.0 0.0 - 0.1 K/uL  Comprehensive metabolic panel  Result Value Ref Range   Sodium 138 135 - 145 mmol/L   Potassium 4.0 3.5 - 5.1 mmol/L   Chloride 102 96 - 112 mmol/L   CO2 27 19 - 32 mmol/L   Glucose, Bld 114 (H) 70 - 99 mg/dL   BUN 15 6 - 23 mg/dL   Creatinine, Ser 8.93 0.50 - 1.35 mg/dL   Calcium 9.9 8.4 - 73.4 mg/dL   Total Protein 8.2 6.0 - 8.3 g/dL   Albumin 5.1 3.5 - 5.2 g/dL   AST 29 0 - 37 U/L   ALT 24 0 - 53 U/L   Alkaline Phosphatase 76 39 - 117 U/L   Total Bilirubin 0.9 0.3 - 1.2 mg/dL   GFR calc non Af Amer >90 >90 mL/min   GFR  calc Af Amer >90 >90 mL/min   Anion gap 9 5 - 15  Lipase, blood  Result Value Ref Range   Lipase 25 11 - 59 U/L  Troponin I  Result Value Ref Range   Troponin I <0.03 <0.031 ng/mL  Urinalysis, Routine w reflex microscopic  Result Value Ref Range   Color, Urine YELLOW YELLOW   APPearance CLEAR CLEAR   Specific Gravity, Urine 1.023 1.005 - 1.030   pH 5.5 5.0 - 8.0   Glucose, UA NEGATIVE NEGATIVE mg/dL   Hgb urine dipstick NEGATIVE NEGATIVE   Bilirubin Urine NEGATIVE NEGATIVE   Ketones, ur 15 (A) NEGATIVE mg/dL   Protein, ur NEGATIVE NEGATIVE mg/dL   Urobilinogen, UA 0.2 0.0 - 1.0 mg/dL   Nitrite NEGATIVE NEGATIVE   Leukocytes, UA NEGATIVE NEGATIVE  Ethanol  Result Value Ref Range   Alcohol, Ethyl (B) <5 0 - 9 mg/dL  Urine rapid drug screen (hosp performed)  Result Value Ref Range   Opiates NONE DETECTED NONE  DETECTED   Cocaine NONE DETECTED NONE DETECTED   Benzodiazepines NONE DETECTED NONE DETECTED   Amphetamines NONE DETECTED NONE DETECTED   Tetrahydrocannabinol POSITIVE (A) NONE DETECTED   Barbiturates NONE DETECTED NONE DETECTED    Patient seen by me. Patient presented with a complaint of epigastric abdominal pain that began this morning with associated with nausea vomiting and diarrhea. Patient's had the bypass surgery in March 2015. Very young age to have this no known cardiac risk factors other than the fact that he had the disease. Unlikely that this is cardiac in nature based on symptoms. First troponin is negative EKG without acute cardiac changes. More concern would be maybe for a viral gastroenteritis. Also possible gallbladder disease. Ultrasound is pending. No leukocytosis no significant liver function test abnormalities. Lipase is not elevated not consistent with pancreatitis. And urinalysis is negative for urinary tract infection. If ultrasound is negative patient can be treated symptomatically.  Vanetta Mulders, MD 06/02/14 2010  In Addition abdomen is without  significant tenderness. Normal bowel sounds. Lungs clear bilaterally. No acute surgical abdomen on exam.  Vanetta Mulders, MD 06/02/14 2011

## 2014-06-02 NOTE — ED Notes (Signed)
Ginger Ale given to Pt.

## 2014-07-06 ENCOUNTER — Other Ambulatory Visit: Payer: Self-pay

## 2014-07-22 ENCOUNTER — Telehealth: Payer: Self-pay | Admitting: *Deleted

## 2014-07-22 NOTE — Telephone Encounter (Signed)
Patient having trouble sleeping is having to use 2/3 pillows according to his mother.  Also she reports patient is having frothy sputum.  He has scheduled appointment 08/02/14 but she does not want him to wait.  Advised her to attend walk in clinic.

## 2014-08-02 ENCOUNTER — Ambulatory Visit: Payer: BLUE CROSS/BLUE SHIELD | Attending: Internal Medicine | Admitting: Internal Medicine

## 2014-08-02 ENCOUNTER — Encounter: Payer: Self-pay | Admitting: Internal Medicine

## 2014-08-02 VITALS — BP 111/68 | HR 74 | Temp 98.0°F | Resp 16 | Wt 224.0 lb

## 2014-08-02 DIAGNOSIS — I10 Essential (primary) hypertension: Secondary | ICD-10-CM | POA: Diagnosis not present

## 2014-08-02 DIAGNOSIS — Z87891 Personal history of nicotine dependence: Secondary | ICD-10-CM | POA: Insufficient documentation

## 2014-08-02 DIAGNOSIS — R059 Cough, unspecified: Secondary | ICD-10-CM

## 2014-08-02 DIAGNOSIS — I5022 Chronic systolic (congestive) heart failure: Secondary | ICD-10-CM | POA: Diagnosis not present

## 2014-08-02 DIAGNOSIS — R05 Cough: Secondary | ICD-10-CM | POA: Insufficient documentation

## 2014-08-02 DIAGNOSIS — J069 Acute upper respiratory infection, unspecified: Secondary | ICD-10-CM | POA: Diagnosis not present

## 2014-08-02 MED ORDER — PRAVASTATIN SODIUM 20 MG PO TABS: 20.0000 mg | ORAL_TABLET | Freq: Every day | ORAL | Status: DC

## 2014-08-02 MED ORDER — BENZONATATE 100 MG PO CAPS: 100.0000 mg | ORAL_CAPSULE | Freq: Three times a day (TID) | ORAL | Status: DC | PRN

## 2014-08-02 MED ORDER — LOSARTAN POTASSIUM 25 MG PO TABS: 25.0000 mg | ORAL_TABLET | Freq: Every day | ORAL | Status: DC

## 2014-08-02 MED ORDER — AZITHROMYCIN 250 MG PO TABS: ORAL_TABLET | ORAL | Status: DC

## 2014-08-02 MED ORDER — METOPROLOL TARTRATE 25 MG PO TABS: 25.0000 mg | ORAL_TABLET | Freq: Two times a day (BID) | ORAL | Status: DC

## 2014-08-02 MED ORDER — FUROSEMIDE 40 MG PO TABS: 40.0000 mg | ORAL_TABLET | Freq: Every day | ORAL | Status: DC

## 2014-08-02 NOTE — Progress Notes (Signed)
MRN: [id number] Name: Ricardo [name]  Sex: male Age: 34 y.o. DOB: 1980/11/02  Allergies: Lisinopril  Chief Complaint  Patient presents with  . Cough    HPI: Patient is 34 y.o. male who history of hypertension, CHF, hyperlipidemia comes today complaining of productive cough stuffy nose postnasal drip congestion for the last 3 weeks, patient denies smoking cigarettes denies any chest pain or shortness of breath, patient also complains of lisinopril making him dizzy and is thinking probably also contributes to his coughing, he denies any fever chills.  Past Medical History  Diagnosis Date  . Migraine   . Hypertension   . Renal disorder   . NSTEMI (non-ST elevated myocardial infarction) 05/21/2013  . CAD, multiple vessel 05/21/2013    RCA & LCx 100%, 90% large RI, 50-70% segmental Prox LAD, 90% D1; EF 35-40% (basal-mid Inf Akinesis & severe HK of mid-anterolateral wall on Echo)  . 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 Ischemic cardiomyopathy 05/21/2013    Past Surgical History  Procedure Laterality Date  . Coronary artery bypass graft N/A 05/25/2013    Procedure: CORONARY ARTERY BYPASS GRAFTING (CABG);  Surgeon: Alleen Borne, MD;  Location: [facility] OR;  Service: Open Heart Surgery;  Laterality: N/A;  . Intraoperative transesophageal echocardiogram N/A 05/25/2013    Procedure: INTRAOPERATIVE TRANSESOPHAGEAL ECHOCARDIOGRAM;  Surgeon: Alleen Borne, MD;  Location: [facility] OR;  Service: Open Heart Surgery;  Laterality: N/A;  . Endovein harvest of greater saphenous vein Right 05/25/2013    Procedure: ENDOVEIN HARVEST OF GREATER SAPHENOUS VEIN;  Surgeon: Alleen Borne, MD;  Location: [facility] OR;  Service: Open Heart Surgery;  Laterality: Right;  . Cardiac catheterization  05/21/2013    RCA & LCx 100%, 90% large RI, 50-70% segmental Prox LAD, 90% D1  . Left heart catheterization with coronary angiogram N/A 05/21/2013    Procedure: LEFT HEART CATHETERIZATION WITH CORONARY ANGIOGRAM;   Surgeon: Peter M Swaziland, MD;  Location: [facility] CATH LAB;  Service: Cardiovascular;  Laterality: N/A;      Medication List       This list is accurate as of: 08/02/14 11:34 AM.  Always use your most recent med list.               aspirin 325 MG EC tablet  Take 1 tablet (325 mg total) by mouth daily.     azithromycin 250 MG tablet  Commonly known as:  ZITHROMAX Z-PAK  Take as directed     benzonatate 100 MG capsule  Commonly known as:  TESSALON  Take 1 capsule (100 mg total) by mouth 3 (three) times daily as needed for cough.     furosemide 40 MG tablet  Commonly known as:  LASIX  Take 1 tablet (40 mg total) by mouth daily. X 7 DAYS  STARTING 06/07/12     losartan 25 MG tablet  Commonly known as:  COZAAR  Take 1 tablet (25 mg total) by mouth daily.     metoprolol tartrate 25 MG tablet  Commonly known as:  LOPRESSOR  Take 1 tablet (25 mg total) by mouth 2 (two) times daily.     omeprazole 20 MG capsule  Commonly known as:  PRILOSEC  Take 1 capsule (20 mg total) by mouth daily.     potassium chloride 10 MEQ tablet  Commonly known as:  K-DUR,KLOR-CON  Take 2 tablets (20 mEq total) by mouth daily. X 7 DAYS    STARTING 06/07/12  pravastatin 20 MG tablet  Commonly known as:  PRAVACHOL  Take 1 tablet (20 mg total) by mouth daily.     promethazine 25 MG tablet  Commonly known as:  PHENERGAN  Take 1 tablet (25 mg total) by mouth every 6 (six) hours as needed for nausea or vomiting.     ranitidine 150 MG tablet  Commonly known as:  ZANTAC  Take 1 tablet (150 mg total) by mouth 2 (two) times daily.        Meds ordered this encounter  Medications  . benzonatate (TESSALON) 100 MG capsule    Sig: Take 1 capsule (100 mg total) by mouth 3 (three) times daily as needed for cough.    Dispense:  30 capsule    Refill:  1  . losartan (COZAAR) 25 MG tablet    Sig: Take 1 tablet (25 mg total) by mouth daily.    Dispense:  30 tablet    Refill:  3  . azithromycin (ZITHROMAX  Z-PAK) 250 MG tablet    Sig: Take as directed    Dispense:  6 each    Refill:  0  . metoprolol tartrate (LOPRESSOR) 25 MG tablet    Sig: Take 1 tablet (25 mg total) by mouth 2 (two) times daily.    Dispense:  60 tablet    Refill:  3  . furosemide (LASIX) 40 MG tablet    Sig: Take 1 tablet (40 mg total) by mouth daily. X 7 DAYS  STARTING 06/07/12    Dispense:  30 tablet    Refill:  3  . pravastatin (PRAVACHOL) 20 MG tablet    Sig: Take 1 tablet (20 mg total) by mouth daily.    Dispense:  30 tablet    Refill:  3    Immunization History  Administered Date(s) Administered  . Influenza,inj,Quad PF,36+ Mos 12/15/2013    Family History  Problem Relation Age of Onset  . Heart disease Father   . Diabetes Mother   . Heart disease Maternal Grandmother   . Stroke Maternal Grandmother     History  Substance Use Topics  . Smoking status: Former Smoker -- 0.75 packs/day    Types: Cigarettes    Quit date: 05/21/2013  . Smokeless tobacco: Never Used  . Alcohol Use: No    Review of Systems   As noted in HPI  Filed Vitals:   08/02/14 0945  BP: 111/68  Pulse: 74  Temp: 98 F (36.7 C)  Resp: 16    Physical Exam  Physical Exam  HENT:  Nasal congestion no sinus tenderness, moist oral mucosa, enlarged tonsils no exudate  Eyes: EOM are normal. Pupils are equal, round, and reactive to light.  Cardiovascular: Normal rate and regular rhythm.   Pulmonary/Chest: Breath sounds normal. No respiratory distress. He has no wheezes. He has no rales.  Abdominal: Soft. There is no tenderness.  Musculoskeletal: He exhibits no edema.  Lymphadenopathy:    He has cervical adenopathy.    CBC    Component Value Date/Time   WBC 8.0 06/02/2014 1740   RBC 5.69 06/02/2014 1740   HGB 16.7 06/02/2014 1740   HCT 47.9 06/02/2014 1740   PLT 272 06/02/2014 1740   MCV 84.2 06/02/2014 1740   LYMPHSABS 1.0 06/02/2014 1740   MONOABS 0.3 06/02/2014 1740   EOSABS 0.0 06/02/2014 1740   BASOSABS 0.0  06/02/2014 1740    CMP     Component Value Date/Time   NA 138 06/02/2014 1740   K  4.0 06/02/2014 1740   CL 102 06/02/2014 1740   CO2 27 06/02/2014 1740   GLUCOSE 114* 06/02/2014 1740   BUN 15 06/02/2014 1740   CREATININE 1.02 06/02/2014 1740   CREATININE 1.09 04/05/2014 1026   CALCIUM 9.9 06/02/2014 1740   PROT 8.2 06/02/2014 1740   ALBUMIN 5.1 06/02/2014 1740   AST 29 06/02/2014 1740   ALT 24 06/02/2014 1740   ALKPHOS 76 06/02/2014 1740   BILITOT 0.9 06/02/2014 1740   GFRNONAA >90 06/02/2014 1740   GFRNONAA 87 04/05/2014 1026   GFRAA >90 06/02/2014 1740   GFRAA >89 04/05/2014 1026    Lab Results  Component Value Date/Time   CHOL 169 04/05/2014 10:26 AM    No results found for: HGBA1C  Lab Results  Component Value Date/Time   AST 29 06/02/2014 05:40 PM    Assessment and Plan  Essential hypertension, benign - Plan:have discontinued lisinopril switched to Cozaar, losartan (COZAAR) 25 MG tablet, furosemide (LASIX) 40 MG tablet  Chronic systolic heart failure - Plan: losartan (COZAAR) 25 MG tablet, metoprolol tartrate (LOPRESSOR) 25 MG tablet, pravastatin (PRAVACHOL) 20 MG tablet  URI (upper respiratory infection) - Plan: azithromycin (ZITHROMAX Z-PAK) 250 MG tablet  Cough - Plan: benzonatate (TESSALON) 100 MG capsule  Will get fasting blood work on the next visit.  Return in about 3 months (around 11/02/2014), or if symptoms worsen or fail to improve.   This note has been created with Education officer, environmental. Any transcriptional errors are unintentional.    Doris Cheadle, MD

## 2014-08-02 NOTE — Patient Instructions (Signed)
DASH Eating Plan °DASH stands for "Dietary Approaches to Stop Hypertension." The DASH eating plan is a healthy eating plan that has been shown to reduce high blood pressure (hypertension). Additional health benefits may include reducing the risk of type 2 diabetes mellitus, heart disease, and stroke. The DASH eating plan may also help with weight loss. °WHAT DO I NEED TO KNOW ABOUT THE DASH EATING PLAN? °For the DASH eating plan, you will follow these general guidelines: °· Choose foods with a percent daily value for sodium of less than 5% (as listed on the food label). °· Use salt-free seasonings or herbs instead of table salt or sea salt. °· Check with your health care provider or pharmacist before using salt substitutes. °· Eat lower-sodium products, often labeled as "lower sodium" or "no salt added." °· Eat fresh foods. °· Eat more vegetables, fruits, and low-fat dairy products. °· Choose whole grains. Look for the word "whole" as the first word in the ingredient list. °· Choose fish and skinless chicken or turkey more often than red meat. Limit fish, poultry, and meat to 6 oz (170 g) each day. °· Limit sweets, desserts, sugars, and sugary drinks. °· Choose heart-healthy fats. °· Limit cheese to 1 oz (28 g) per day. °· Eat more home-cooked food and less restaurant, buffet, and fast food. °· Limit fried foods. °· Cook foods using methods other than frying. °· Limit canned vegetables. If you do use them, rinse them well to decrease the sodium. °· When eating at a restaurant, ask that your food be prepared with less salt, or no salt if possible. °WHAT FOODS CAN I EAT? °Seek help from a dietitian for individual calorie needs. °Grains °Whole grain or whole wheat bread. Brown rice. Whole grain or whole wheat pasta. Quinoa, bulgur, and whole grain cereals. Low-sodium cereals. Corn or whole wheat flour tortillas. Whole grain cornbread. Whole grain crackers. Low-sodium crackers. °Vegetables °Fresh or frozen vegetables  (raw, steamed, roasted, or grilled). Low-sodium or reduced-sodium tomato and vegetable juices. Low-sodium or reduced-sodium tomato sauce and paste. Low-sodium or reduced-sodium canned vegetables.  °Fruits °All fresh, canned (in natural juice), or frozen fruits. °Meat and Other Protein Products °Ground beef (85% or leaner), grass-fed beef, or beef trimmed of fat. Skinless chicken or turkey. Ground chicken or turkey. Pork trimmed of fat. All fish and seafood. Eggs. Dried beans, peas, or lentils. Unsalted nuts and seeds. Unsalted canned beans. °Dairy °Low-fat dairy products, such as skim or 1% milk, 2% or reduced-fat cheeses, low-fat ricotta or cottage cheese, or plain low-fat yogurt. Low-sodium or reduced-sodium cheeses. °Fats and Oils °Tub margarines without trans fats. Light or reduced-fat mayonnaise and salad dressings (reduced sodium). Avocado. Safflower, olive, or canola oils. Natural peanut or almond butter. °Other °Unsalted popcorn and pretzels. °The items listed above may not be a complete list of recommended foods or beverages. Contact your dietitian for more options. °WHAT FOODS ARE NOT RECOMMENDED? °Grains °White bread. White pasta. White rice. Refined cornbread. Bagels and croissants. Crackers that contain trans fat. °Vegetables °Creamed or fried vegetables. Vegetables in a cheese sauce. Regular canned vegetables. Regular canned tomato sauce and paste. Regular tomato and vegetable juices. °Fruits °Dried fruits. Canned fruit in light or heavy syrup. Fruit juice. °Meat and Other Protein Products °Fatty cuts of meat. Ribs, chicken wings, bacon, sausage, bologna, salami, chitterlings, fatback, hot dogs, bratwurst, and packaged luncheon meats. Salted nuts and seeds. Canned beans with salt. °Dairy °Whole or 2% milk, cream, half-and-half, and cream cheese. Whole-fat or sweetened yogurt. Full-fat   cheeses or blue cheese. Nondairy creamers and whipped toppings. Processed cheese, cheese spreads, or cheese  curds. °Condiments °Onion and garlic salt, seasoned salt, table salt, and sea salt. Canned and packaged gravies. Worcestershire sauce. Tartar sauce. Barbecue sauce. Teriyaki sauce. Soy sauce, including reduced sodium. Steak sauce. Fish sauce. Oyster sauce. Cocktail sauce. Horseradish. Ketchup and mustard. Meat flavorings and tenderizers. Bouillon cubes. Hot sauce. Tabasco sauce. Marinades. Taco seasonings. Relishes. °Fats and Oils °Butter, stick margarine, lard, shortening, ghee, and bacon fat. Coconut, palm kernel, or palm oils. Regular salad dressings. °Other °Pickles and olives. Salted popcorn and pretzels. °The items listed above may not be a complete list of foods and beverages to avoid. Contact your dietitian for more information. °WHERE CAN I FIND MORE INFORMATION? °National Heart, [employer], [employer] Institute: www.nhlbi.nih.gov/health/health-topics/topics/dash/ °Document Released: 02/28/2011 Document Revised: 07/26/2013 Document Reviewed: 01/13/2013 °ExitCare® Patient Information ©2015 [employer]. This information is not intended to replace advice given to you by your health care provider. Make sure you discuss any questions you have with your health care provider. ° °

## 2014-08-02 NOTE — Progress Notes (Signed)
Patient complains of cough and congestion for about a week or so Denies any fever Patient also complains he does not like the way his lisinopril makes him feel

## 2014-10-24 IMAGING — CT CT ABD-PELV W/ CM
2 of 4 series · 4 of 46 positions shown, 6 images · IV contrast (omnipaque)
Comparison: 12/02/2013.

CLINICAL DATA: Pain.

EXAM:
CT ABDOMEN AND PELVIS WITH CONTRAST
TECHNIQUE: Multidetector CT imaging of the abdomen and pelvis was performed
using the standard protocol following bolus administration of
intravenous contrast.
CONTRAST:  80mL OMNIPAQUE IOHEXOL 300 MG/ML  SOLN

[Series 204: cor · coronal · 0.50mm/px · 3 of 86 slices shown, 4 images]
[im 19/86  soft-tissue]
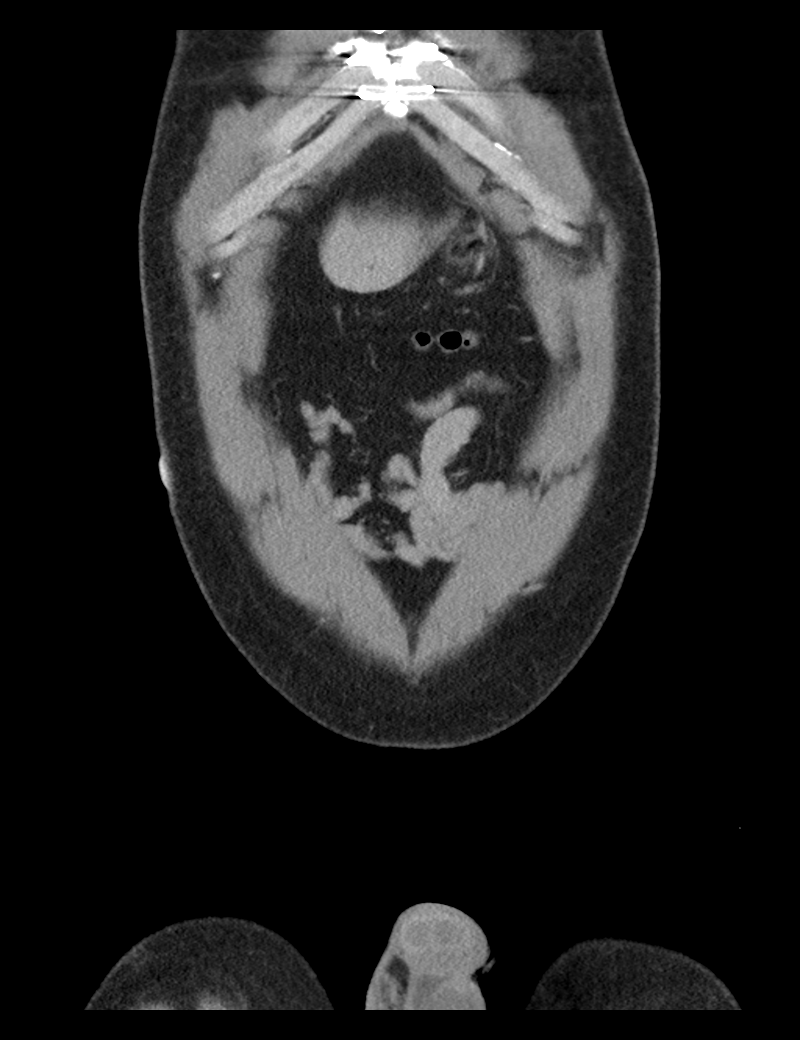
[im 19/86  bone]
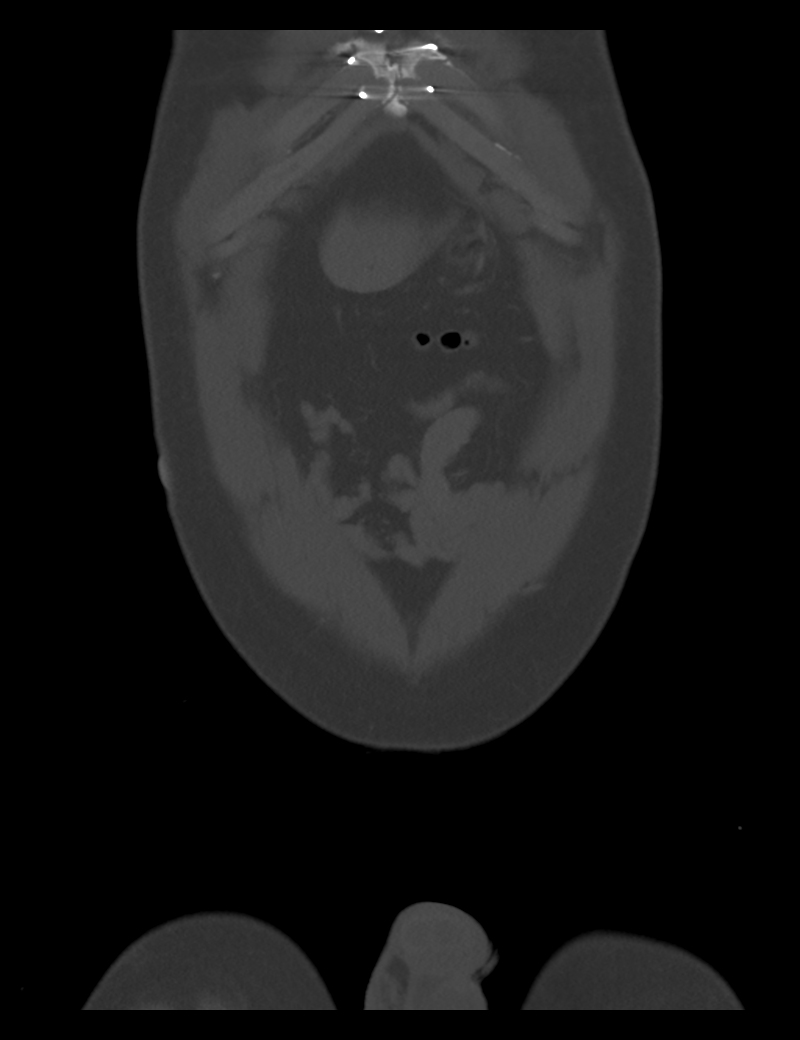
[im 48/86  soft-tissue]
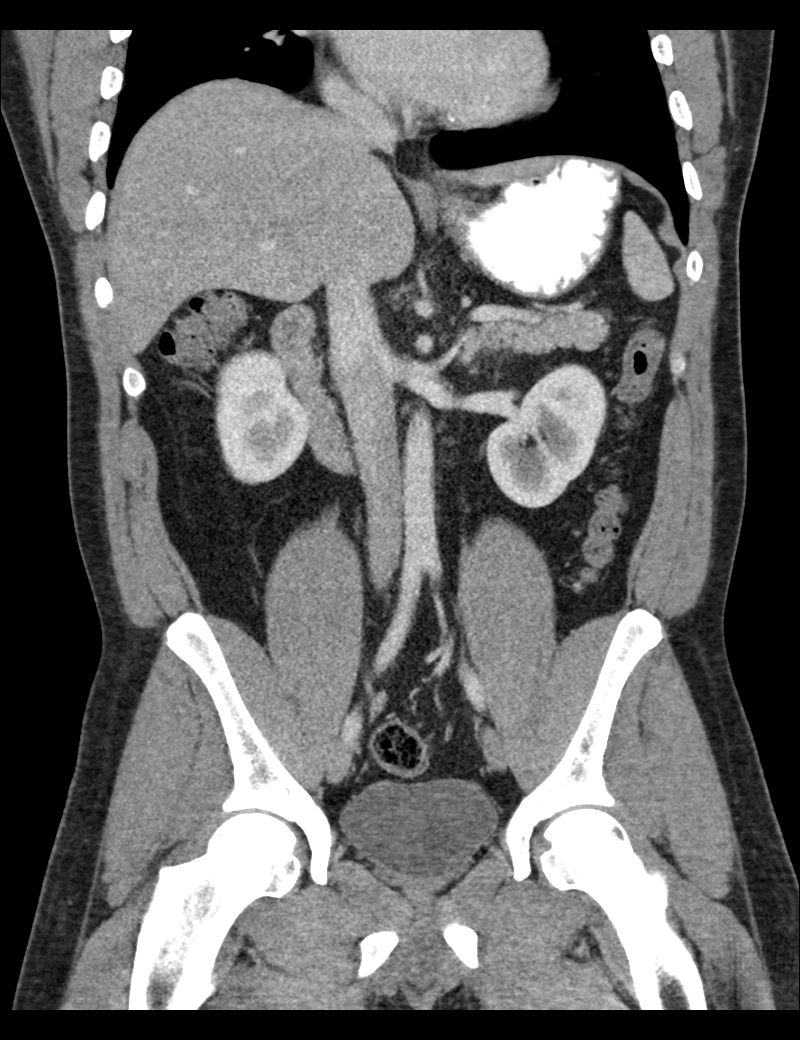
[im 67/86  soft-tissue]
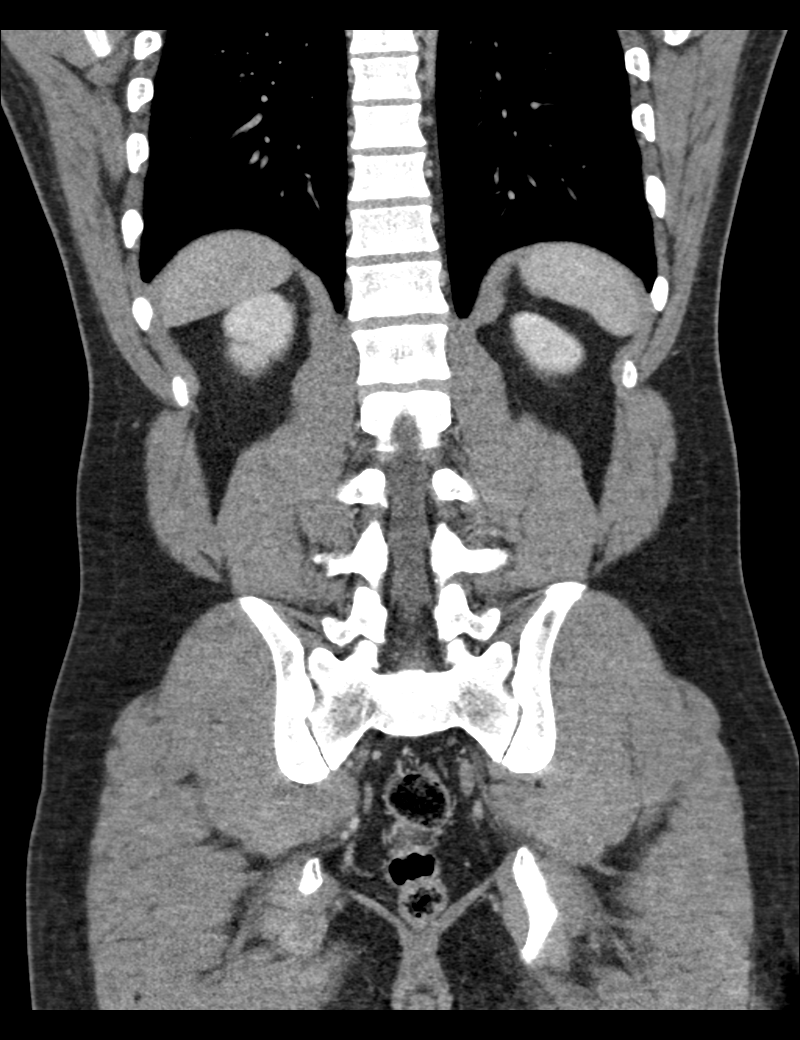

[Series 205: sag · sagittal · 0.50mm/px · 1 of 103 slices shown, 2 images]
[im 35/103  soft-tissue]
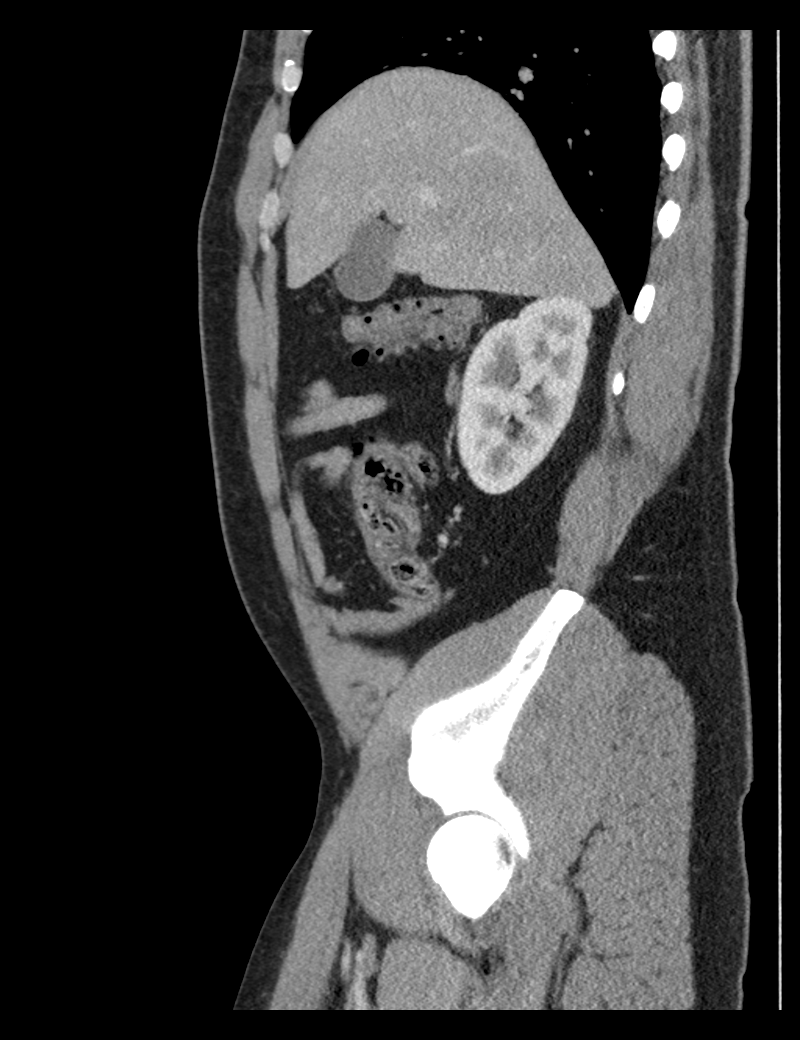
[im 35/103  bone]
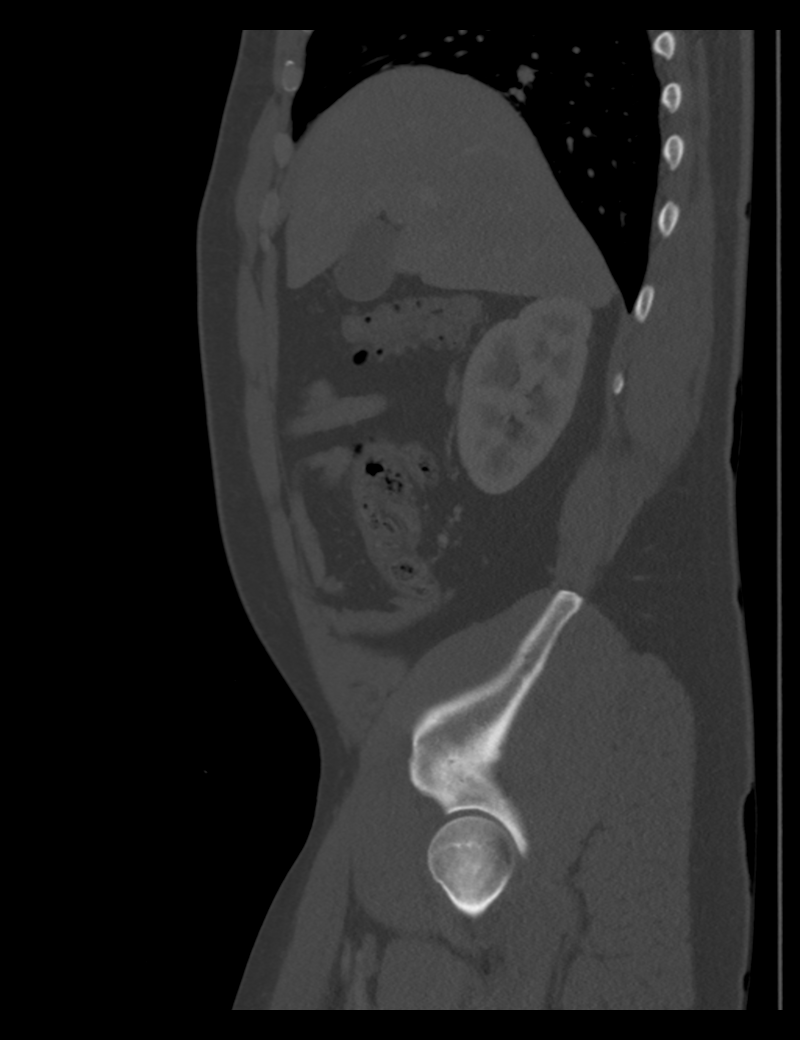

[4 of 46 positions shown; findings below may reference images not displayed]

FINDINGS: Liver normal. Spleen normal. Pancreas normal. Gallbladder normal. No
biliary distention.

Adrenals normal. Kidneys normal. No hydronephrosis or obstructing
ureteral stone. The bladder is nondistended.

No significant inguinal or retroperitoneal adenopathy. Abdominal
aorta normal in caliber. Distal vessels are patent.

Appendix normal. Colonic diverticulosis. No evidence of
diverticulitis. No bowel distention. Stomach is nondistended. Small
sliding hiatal hernia. No free air. No mesenteric mass. Tiny
umbilical hernia with herniation of fat only.

Mild bibasilar atelectasis. Mild cardiomegaly. Prior median
sternotomy. No acute bony abnormality.
IMPRESSION: 1. Colonic diverticulosis.  No evidence of diverticulitis.

2.  No acute abnormality.

## 2014-11-25 ENCOUNTER — Other Ambulatory Visit: Payer: Self-pay | Admitting: Internal Medicine

## 2014-11-25 DIAGNOSIS — I10 Essential (primary) hypertension: Secondary | ICD-10-CM

## 2014-11-25 DIAGNOSIS — I5022 Chronic systolic (congestive) heart failure: Secondary | ICD-10-CM

## 2014-11-29 ENCOUNTER — Telehealth: Payer: Self-pay

## 2014-11-29 MED ORDER — METOPROLOL TARTRATE 25 MG PO TABS: 25.0000 mg | ORAL_TABLET | Freq: Two times a day (BID) | ORAL | Status: DC

## 2014-11-29 MED ORDER — PRAVASTATIN SODIUM 20 MG PO TABS: 20.0000 mg | ORAL_TABLET | Freq: Every day | ORAL | Status: DC

## 2014-11-29 MED ORDER — LOSARTAN POTASSIUM 25 MG PO TABS: 25.0000 mg | ORAL_TABLET | Freq: Every day | ORAL | Status: DC

## 2014-11-29 NOTE — Telephone Encounter (Signed)
Returned patient phone call Patient not available Left message on voice mail to return our call 

## 2014-11-29 NOTE — Telephone Encounter (Signed)
Patient called to request a med refill for his current blood pressure medication. Patient uses [facility] on [address]. Please f/u with pt.

## 2014-12-02 ENCOUNTER — Ambulatory Visit: Payer: Self-pay | Admitting: Family Medicine

## 2014-12-30 ENCOUNTER — Other Ambulatory Visit: Payer: Self-pay | Admitting: Internal Medicine

## 2015-01-04 NOTE — Telephone Encounter (Signed)
Patient called stating his pharmacy has faxed for a med refill on lisinopril (PRINIVIL,ZESTRIL) 10 MG tablet, and furosemide (LASIX) 40 MG tablet. Please f/u with pt.

## 2015-01-05 ENCOUNTER — Telehealth: Payer: Self-pay

## 2015-01-05 NOTE — Telephone Encounter (Signed)
Attempted to call patient about refill request Request came through for lisinopril and lasix Patient was taken off lisinopril and prescribed losartan Tried to call patient to clarify but home phone is not a working number

## 2015-01-05 NOTE — Telephone Encounter (Signed)
Pt. Returned call. Please f/u with pt. °

## 2015-01-06 ENCOUNTER — Telehealth: Payer: Self-pay

## 2015-01-06 DIAGNOSIS — I10 Essential (primary) hypertension: Secondary | ICD-10-CM

## 2015-01-06 DIAGNOSIS — I5022 Chronic systolic (congestive) heart failure: Secondary | ICD-10-CM

## 2015-01-06 MED ORDER — LOSARTAN POTASSIUM 25 MG PO TABS: 25.0000 mg | ORAL_TABLET | Freq: Every day | ORAL | Status: DC

## 2015-01-06 MED ORDER — FUROSEMIDE 40 MG PO TABS: 40.0000 mg | ORAL_TABLET | Freq: Every day | ORAL | Status: DC

## 2015-01-06 NOTE — Telephone Encounter (Signed)
Pt. Returned call requesting his med refill on lisinopril (PRINIVIL,ZESTRIL) 10 MG tablet, and furosemide (LASIX) 40 MG tablet. Please f/u with pt.

## 2015-01-18 ENCOUNTER — Other Ambulatory Visit: Payer: Self-pay | Admitting: *Deleted

## 2015-01-18 MED ORDER — LISINOPRIL 10 MG PO TABS: 10.0000 mg | ORAL_TABLET | Freq: Every day | ORAL | Status: DC

## 2015-01-18 NOTE — Telephone Encounter (Signed)
Lisinopril has been refilled with 3 refills.  Patient will need an appointment asap to obtain future refills.

## 2015-02-02 ENCOUNTER — Ambulatory Visit: Payer: Self-pay | Attending: Family Medicine | Admitting: Family Medicine

## 2015-02-02 ENCOUNTER — Encounter: Payer: Self-pay | Admitting: Family Medicine

## 2015-02-02 VITALS — BP 127/78 | HR 65 | Temp 98.6°F | Resp 16 | Ht 68.0 in | Wt 216.0 lb

## 2015-02-02 DIAGNOSIS — Z114 Encounter for screening for human immunodeficiency virus [HIV]: Secondary | ICD-10-CM

## 2015-02-02 DIAGNOSIS — K219 Gastro-esophageal reflux disease without esophagitis: Secondary | ICD-10-CM | POA: Insufficient documentation

## 2015-02-02 DIAGNOSIS — K2100 Gastro-esophageal reflux disease with esophagitis, without bleeding: Secondary | ICD-10-CM

## 2015-02-02 DIAGNOSIS — Z0000 Encounter for general adult medical examination without abnormal findings: Secondary | ICD-10-CM

## 2015-02-02 DIAGNOSIS — I110 Hypertensive heart disease with heart failure: Secondary | ICD-10-CM | POA: Insufficient documentation

## 2015-02-02 DIAGNOSIS — I5022 Chronic systolic (congestive) heart failure: Secondary | ICD-10-CM

## 2015-02-02 DIAGNOSIS — I10 Essential (primary) hypertension: Secondary | ICD-10-CM

## 2015-02-02 LAB — BASIC METABOLIC PANEL
BUN: 13 mg/dL (ref 7–25)
CO2: 25 mmol/L (ref 20–31)
CREATININE: 0.95 mg/dL (ref 0.60–1.35)
Calcium: 9.5 mg/dL (ref 8.6–10.3)
Chloride: 102 mmol/L (ref 98–110)
Glucose, Bld: 84 mg/dL (ref 65–99)
Potassium: 4.2 mmol/L (ref 3.5–5.3)
Sodium: 137 mmol/L (ref 135–146)

## 2015-02-02 MED ORDER — FUROSEMIDE 40 MG PO TABS: 40.0000 mg | ORAL_TABLET | Freq: Every day | ORAL | Status: AC

## 2015-02-02 MED ORDER — PRAVASTATIN SODIUM 20 MG PO TABS: 20.0000 mg | ORAL_TABLET | Freq: Every day | ORAL | Status: AC

## 2015-02-02 MED ORDER — LOSARTAN POTASSIUM 25 MG PO TABS: 25.0000 mg | ORAL_TABLET | Freq: Every day | ORAL | Status: AC

## 2015-02-02 MED ORDER — RANITIDINE HCL 150 MG PO TABS: 150.0000 mg | ORAL_TABLET | Freq: Two times a day (BID) | ORAL | Status: AC

## 2015-02-02 MED ORDER — METOPROLOL TARTRATE 25 MG PO TABS: 25.0000 mg | ORAL_TABLET | Freq: Two times a day (BID) | ORAL | Status: AC

## 2015-02-02 NOTE — Assessment & Plan Note (Signed)
A; BP well controlled Meds: compliant P: Refilled all meds, 3 month supply BMP to check potassium level

## 2015-02-02 NOTE — Patient Instructions (Signed)
Mae was seen today for hypertension.  Diagnoses and all orders for this visit:  Essential hypertension -     Basic Metabolic Panel -     furosemide (LASIX) 40 MG tablet; Take 1 tablet (40 mg total) by mouth daily. -     losartan (COZAAR) 25 MG tablet; Take 1 tablet (25 mg total) by mouth daily. -     metoprolol tartrate (LOPRESSOR) 25 MG tablet; Take 1 tablet (25 mg total) by mouth 2 (two) times daily.  Chronic systolic heart failure (HCC) -     losartan (COZAAR) 25 MG tablet; Take 1 tablet (25 mg total) by mouth daily. -     metoprolol tartrate (LOPRESSOR) 25 MG tablet; Take 1 tablet (25 mg total) by mouth 2 (two) times daily. -     pravastatin (PRAVACHOL) 20 MG tablet; Take 1 tablet (20 mg total) by mouth daily.  Gastroesophageal reflux disease with esophagitis -     ranitidine (ZANTAC) 150 MG tablet; Take 1 tablet (150 mg total) by mouth 2 (two) times daily.   You will be called with potassium results  All meds refilled with 90 day supply   F/u in 3 months for HTN  Dr. Armen Pickup

## 2015-02-02 NOTE — Progress Notes (Signed)
Subjective:  Patient ID: Ricardo [name], male    DOB: 09/11/1980  Age: 34 y.o. MRN: [id number]  CC: Hypertension   HPI Ricardo [name] presents for HTN f/u  He has CAD, systolic CHF. S/p triple bypass.   1. CHRONIC HYPERTENSION  Disease Monitoring  Blood pressure range: not checking   Chest pain: no   Dyspnea: no   Claudication: no   Medication compliance: yes  Medication Side Effects  Lightheadedness: no   Urinary frequency: yes, 45 minutes after taking lasix most days    Edema: no    Preventitive Healthcare:  Exercise: yes   Diet Pattern: regular meals, cooks at home mostly   Salt Restriction: yes  Social History  Substance Use Topics  . Smoking status: Former Smoker -- 0.75 packs/day    Types: Cigarettes    Quit date: 05/21/2013  . Smokeless tobacco: Never Used  . Alcohol Use: No    Outpatient Prescriptions Prior to Visit  Medication Sig Dispense Refill  . aspirin 325 MG EC tablet Take 1 tablet (325 mg total) by mouth daily. 30 tablet 3  . furosemide (LASIX) 40 MG tablet Take 1 tablet (40 mg total) by mouth daily. X 7 DAYS  STARTING 06/07/12 30 tablet 0  . losartan (COZAAR) 25 MG tablet Take 1 tablet (25 mg total) by mouth daily. 30 tablet 0  . metoprolol tartrate (LOPRESSOR) 25 MG tablet Take 1 tablet (25 mg total) by mouth 2 (two) times daily. 60 tablet 3  . pravastatin (PRAVACHOL) 20 MG tablet Take 1 tablet (20 mg total) by mouth daily. 30 tablet 3  . ranitidine (ZANTAC) 150 MG tablet Take 1 tablet (150 mg total) by mouth 2 (two) times daily. 60 tablet 0  . azithromycin (ZITHROMAX Z-PAK) 250 MG tablet Take as directed (Patient not taking: Reported on 02/02/2015) 6 each 0  . benzonatate (TESSALON) 100 MG capsule Take 1 capsule (100 mg total) by mouth 3 (three) times daily as needed for cough. (Patient not taking: Reported on 02/02/2015) 30 capsule 1  . lisinopril (PRINIVIL,ZESTRIL) 10 MG tablet Take 1 tablet (10 mg total) by mouth daily. (Patient not taking:  Reported on 02/02/2015) 30 tablet 3  . omeprazole (PRILOSEC) 20 MG capsule Take 1 capsule (20 mg total) by mouth daily. (Patient not taking: Reported on 02/02/2015) 30 capsule 3  . potassium chloride (K-DUR,KLOR-CON) 10 MEQ tablet Take 2 tablets (20 mEq total) by mouth daily. X 7 DAYS    STARTING 06/07/12 (Patient not taking: Reported on 02/02/2015) 30 tablet 3  . promethazine (PHENERGAN) 25 MG tablet Take 1 tablet (25 mg total) by mouth every 6 (six) hours as needed for nausea or vomiting. (Patient not taking: Reported on 02/02/2015) 10 tablet 0   No facility-administered medications prior to visit.    ROS Review of Systems  Constitutional: Negative for fever, chills, fatigue and unexpected weight change.  Eyes: Negative for visual disturbance.  Respiratory: Negative for cough and shortness of breath.   Cardiovascular: Negative for chest pain, palpitations and leg swelling.  Gastrointestinal: Negative for nausea, vomiting, abdominal pain, diarrhea, constipation and blood in stool.  Endocrine: Negative for polydipsia, polyphagia and polyuria.  Musculoskeletal: Negative for myalgias, back pain, arthralgias, gait problem and neck pain.  Skin: Negative for rash.  Allergic/Immunologic: Negative for immunocompromised state.  Hematological: Negative for adenopathy. Does not bruise/bleed easily.  Psychiatric/Behavioral: Negative for suicidal ideas, sleep disturbance and dysphoric mood. The patient is not nervous/anxious.     Objective:  BP  127/78 mmHg  Pulse 65  Temp(Src) 98.6 F (37 C) (Oral)  Resp 16  Ht 5\' 8"  (1.727 m)  Wt 216 lb (97.977 kg)  BMI 32.85 kg/m2  SpO2 98%  BP/Weight 02/02/2015 08/02/2014 06/02/2014  Systolic BP 127 111 138  Diastolic BP 78 68 78  Wt. (Lbs) 216 224 225  BMI 32.85 35.08 35.23  Some encounter information is confidential and restricted. Go to Review Flowsheets activity to see all data.    Physical Exam  Constitutional: He appears well-developed and  well-nourished. No distress.  HENT:  Head: Normocephalic and atraumatic.  Neck: Normal range of motion. Neck supple.  Cardiovascular: Normal rate, regular rhythm, normal heart sounds and intact distal pulses.   Pulmonary/Chest: Effort normal and breath sounds normal.  Musculoskeletal: He exhibits no edema.  Neurological: He is alert.  Skin: Skin is warm and dry. No rash noted. No erythema.  Psychiatric: He has a normal mood and affect.    Assessment & Plan:   Problem List Items Addressed This Visit    Chronic systolic heart failure (HCC) (Chronic)   Relevant Medications   furosemide (LASIX) 40 MG tablet   losartan (COZAAR) 25 MG tablet   metoprolol tartrate (LOPRESSOR) 25 MG tablet   pravastatin (PRAVACHOL) 20 MG tablet   GERD (gastroesophageal reflux disease) (Chronic)   Relevant Medications   ranitidine (ZANTAC) 150 MG tablet   HTN (hypertension) - Primary (Chronic)   Relevant Medications   furosemide (LASIX) 40 MG tablet   losartan (COZAAR) 25 MG tablet   metoprolol tartrate (LOPRESSOR) 25 MG tablet   pravastatin (PRAVACHOL) 20 MG tablet   Other Relevant Orders   Basic Metabolic Panel    Other Visit Diagnoses    Screening for HIV (human immunodeficiency virus)        Relevant Orders    HIV antibody (with reflex)       No orders of the defined types were placed in this encounter.    Follow-up: No Follow-up on file.   Dessa Phi MD

## 2015-02-03 LAB — HIV ANTIBODY (ROUTINE TESTING W REFLEX): HIV 1&2 Ab, 4th Generation: NONREACTIVE

## 2015-03-21 NOTE — Telephone Encounter (Signed)
refill 

## 2015-05-04 ENCOUNTER — Ambulatory Visit: Payer: Self-pay | Admitting: Family Medicine

## 2023-07-17 ENCOUNTER — Encounter (HOSPITAL_BASED_OUTPATIENT_CLINIC_OR_DEPARTMENT_OTHER): Payer: Self-pay | Admitting: Emergency Medicine

## 2023-07-17 ENCOUNTER — Emergency Department (HOSPITAL_BASED_OUTPATIENT_CLINIC_OR_DEPARTMENT_OTHER)
Admission: EM | Admit: 2023-07-17 | Discharge: 2023-07-17 | Disposition: A | Discharge status: Home / Self Care | Source: Home / Self Care | Attending: Emergency Medicine | Admitting: Emergency Medicine

## 2023-07-17 ENCOUNTER — Emergency Department (HOSPITAL_BASED_OUTPATIENT_CLINIC_OR_DEPARTMENT_OTHER)

## 2023-07-17 ENCOUNTER — Emergency Department (HOSPITAL_COMMUNITY)
Admission: EM | Admit: 2023-07-17 | Discharge: 2023-07-17 | Disposition: A | Discharge status: Against Medical Advice | Attending: Emergency Medicine | Admitting: Emergency Medicine

## 2023-07-17 ENCOUNTER — Other Ambulatory Visit: Payer: Self-pay

## 2023-07-17 DIAGNOSIS — R1110 Vomiting, unspecified: Secondary | ICD-10-CM | POA: Insufficient documentation

## 2023-07-17 DIAGNOSIS — R1032 Left lower quadrant pain: Secondary | ICD-10-CM | POA: Insufficient documentation

## 2023-07-17 DIAGNOSIS — Z5321 Procedure and treatment not carried out due to patient leaving prior to being seen by health care provider: Secondary | ICD-10-CM | POA: Insufficient documentation

## 2023-07-17 DIAGNOSIS — R197 Diarrhea, unspecified: Secondary | ICD-10-CM | POA: Insufficient documentation

## 2023-07-17 DIAGNOSIS — Z7982 Long term (current) use of aspirin: Secondary | ICD-10-CM | POA: Insufficient documentation

## 2023-07-17 DIAGNOSIS — R109 Unspecified abdominal pain: Secondary | ICD-10-CM | POA: Diagnosis present

## 2023-07-17 DIAGNOSIS — R112 Nausea with vomiting, unspecified: Secondary | ICD-10-CM | POA: Diagnosis not present

## 2023-07-17 DIAGNOSIS — Z79899 Other long term (current) drug therapy: Secondary | ICD-10-CM | POA: Insufficient documentation

## 2023-07-17 LAB — COMPREHENSIVE METABOLIC PANEL WITH GFR
ALT: 21 U/L (ref 0–44)
AST: 24 U/L (ref 15–41)
Albumin: 3.9 g/dL (ref 3.5–5.0)
Alkaline Phosphatase: 65 U/L (ref 38–126)
Anion gap: 10 (ref 5–15)
BUN: 9 mg/dL (ref 6–20)
CO2: 23 mmol/L (ref 22–32)
Calcium: 9.4 mg/dL (ref 8.9–10.3)
Chloride: 106 mmol/L (ref 98–111)
Creatinine, Ser: 0.96 mg/dL (ref 0.61–1.24)
GFR, Estimated: >60 mL/min (ref >60)
Glucose, Bld: 113 mg/dL — ABNORMAL HIGH (ref 70–99)
Potassium: 3.6 mmol/L (ref 3.5–5.1)
Sodium: 139 mmol/L (ref 135–145)
Total Bilirubin: 1.8 mg/dL — ABNORMAL HIGH (ref 0.0–1.2)
Total Protein: 7 g/dL (ref 6.5–8.1)

## 2023-07-17 LAB — URINALYSIS, ROUTINE W REFLEX MICROSCOPIC
Bilirubin Urine: NEGATIVE
Glucose, UA: NEGATIVE mg/dL
Hgb urine dipstick: NEGATIVE
Ketones, ur: NEGATIVE mg/dL
Leukocytes,Ua: NEGATIVE
Nitrite: NEGATIVE
Protein, ur: NEGATIVE mg/dL
Specific Gravity, Urine: 1.018 (ref 1.005–1.030)
pH: 5 (ref 5.0–8.0)

## 2023-07-17 LAB — CBC
HCT: 47.8 % (ref 39.0–52.0)
Hemoglobin: 16 g/dL (ref 13.0–17.0)
MCH: 30 pg (ref 26.0–34.0)
MCHC: 33.5 g/dL (ref 30.0–36.0)
MCV: 89.5 fL (ref 80.0–100.0)
Platelets: 221 10*3/uL (ref 150–400)
RBC: 5.34 MIL/uL (ref 4.22–5.81)
RDW: 13.8 % (ref 11.5–15.5)
WBC: 7.9 10*3/uL (ref 4.0–10.5)
nRBC: 0 % (ref 0.0–0.2)

## 2023-07-17 LAB — LIPASE, BLOOD: Lipase: 32 U/L (ref 11–51)

## 2023-07-17 MED ORDER — SODIUM CHLORIDE 0.9 % IV BOLUS
1000.0000 mL | Freq: Once | INTRAVENOUS | Status: AC
  Administered 2023-07-17: 1000 mL via INTRAVENOUS

## 2023-07-17 MED ORDER — IOHEXOL 300 MG/ML  SOLN
100.0000 mL | Freq: Once | INTRAMUSCULAR | Status: AC | PRN
  Administered 2023-07-17: 100 mL via INTRAVENOUS

## 2023-07-17 MED ORDER — ONDANSETRON 4 MG PO TBDP: 4.0000 mg | ORAL_TABLET | Freq: Three times a day (TID) | ORAL | 0 refills | Status: AC | PRN

## 2023-07-17 MED ORDER — ONDANSETRON HCL 4 MG/2ML IJ SOLN
4.0000 mg | Freq: Once | INTRAMUSCULAR | Status: AC
  Administered 2023-07-17: 4 mg via INTRAVENOUS
  Filled 2023-07-17: qty 2

## 2023-07-17 NOTE — ED Triage Notes (Signed)
 The pt is c/o having abd pain with n v and diarrhea for 4 days

## 2023-07-17 NOTE — ED Notes (Signed)

## 2023-07-17 NOTE — ED Provider Notes (Addendum)
 [place] EMERGENCY DEPARTMENT AT [facility] Provider Note   CSN: [id number] Arrival date & time: 07/17/23  4098     History  Chief Complaint  Patient presents with   Abdominal Pain    Ricardo [name] is a 43 y.o. male.  Patient with a complaint of lower quadrant abdominal pain for 4 days associated with vomiting and diarrhea.  Patient did have lab work done over at [facility] but was not physically seen.  Did not have a CT scan of his abdomen.  Patient states she has had pain like this in the past but never could figure out what the cause was.  Denies any fevers.  Not vomiting any blood no blood in the bowel movements.  Temp 97.6 pulse 57 respiration 16 blood pressure 139/86 oxygen sats 98% on room air.       Home Medications Prior to Admission medications   Medication Sig Start Date End Date Taking? Authorizing Provider  aspirin  325 MG EC tablet Take 1 tablet (325 mg total) by mouth daily. 04/06/14   Wall, Craige Dixon, MD  furosemide  (LASIX ) 40 MG tablet Take 1 tablet (40 mg total) by mouth daily. 02/02/15   Funches, Josalyn, MD  losartan  (COZAAR ) 25 MG tablet Take 1 tablet (25 mg total) by mouth daily. 02/02/15   Funches, Josalyn, MD  metoprolol  tartrate (LOPRESSOR ) 25 MG tablet Take 1 tablet (25 mg total) by mouth 2 (two) times daily. 02/02/15   Funches, Josalyn, MD  potassium chloride  (K-DUR,KLOR-CON ) 10 MEQ tablet Take 2 tablets (20 mEq total) by mouth daily. X 7 DAYS    STARTING 06/07/12 Patient not taking: Reported on 02/02/2015 04/05/14   Advani, Deepak, MD  pravastatin  (PRAVACHOL ) 20 MG tablet Take 1 tablet (20 mg total) by mouth daily. 02/02/15   Funches, Josalyn, MD  ranitidine  (ZANTAC ) 150 MG tablet Take 1 tablet (150 mg total) by mouth 2 (two) times daily. 02/02/15   Funches, Josalyn, MD      Allergies    Lisinopril     Review of Systems   Review of Systems  Constitutional:  Negative for chills and fever.  HENT:  Negative for ear pain and sore throat.    Eyes:  Negative for pain and visual disturbance.  Respiratory:  Negative for cough and shortness of breath.   Cardiovascular:  Negative for chest pain and palpitations.  Gastrointestinal:  Positive for abdominal pain, diarrhea, nausea and vomiting.  Genitourinary:  Negative for dysuria and hematuria.  Musculoskeletal:  Negative for arthralgias and back pain.  Skin:  Negative for color change and rash.  Neurological:  Negative for seizures and syncope.  All other systems reviewed and are negative.   Physical Exam Updated Vital Signs BP 139/86   Pulse (!) 57   Temp 97.6 F (36.4 C) (Oral)   Resp 16   SpO2 98%  Physical Exam Vitals and nursing note reviewed.  Constitutional:      General: He is not in acute distress.    Appearance: Normal appearance. He is well-developed. He is not ill-appearing.  HENT:     Head: Normocephalic and atraumatic.  Eyes:     Conjunctiva/sclera: Conjunctivae normal.  Cardiovascular:     Rate and Rhythm: Normal rate and regular rhythm.     Heart sounds: No murmur heard. Pulmonary:     Effort: Pulmonary effort is normal. No respiratory distress.     Breath sounds: Normal breath sounds.  Abdominal:     Palpations: Abdomen is soft.  Tenderness: There is no abdominal tenderness. There is no guarding.  Musculoskeletal:        General: No swelling.     Cervical back: Neck supple.  Skin:    General: Skin is warm and dry.     Capillary Refill: Capillary refill takes less than 2 seconds.  Neurological:     General: No focal deficit present.     Mental Status: He is alert and oriented to person, place, and time.  Psychiatric:        Mood and Affect: Mood normal.     ED Results / Procedures / Treatments   Labs (all labs ordered are listed, but only abnormal results are displayed) Labs Reviewed - No data to display  EKG None  Radiology No results found.  Procedures Procedures    Medications Ordered in ED Medications - No data to  display  ED Course/ Medical Decision Making/ A&P                                 Medical Decision Making Amount and/or Complexity of Data Reviewed Radiology: ordered.  Risk Prescription drug management.   Will give IV fluids.  Will get CT scan abdomen pelvis.  Patient did have labs done over at the [facility] ED.  White count was 7.9 hemoglobin was 16.  Platelets 221.  Complete metabolic panel was normal including renal function except for a total bili of 1.8.  Lipase was normal at 32 urinalysis was negative.  CT scan negative for any acute findings.  Patient's labs were also negative over at [facility] earlier today.  Will treat symptomatically.   Final Clinical Impression(s) / ED Diagnoses Final diagnoses:  Left lower quadrant abdominal pain    Rx / DC Orders ED Discharge Orders     None         Nicklas Barns, MD 07/17/23 1610    Nicklas Barns, MD 07/17/23 1102    Nicklas Barns, MD 07/17/23 [phone]

## 2023-07-17 NOTE — Discharge Instructions (Addendum)
 Follow-up with gastroenterology.  Take the antinausea medicine dissolvable Zofran  as needed for nausea and vomiting.  Despite the history of some of the diarrhea.  CT scan actually shows a little bit of mild to moderate constipation.  Would recommend over-the-counter MiraLAX for that but if you are having some diarrhea we will hold off on starting that.  But would not recommend an antidiarrhea medicine at this time..  CT scan today without any acute findings.  And labs without any significant abnormalities.  Work note provided.  Also would recommend following back up with your primary care doctor.  They may be to get you into gastroenterology earlier.  Most likely need colonoscopy as the initial part of the evaluation.

## 2023-07-17 NOTE — ED Triage Notes (Signed)
 Pt c/o upper abd pain and LLQ pain x 4 day with n/v/d. LWBS from [facility]
